# Patient Record
Sex: Female | Born: 1965 | Race: White | Hispanic: No | Marital: Married | Smoking: Never smoker
Health system: Southern US, Community
[De-identification: ages and names within clinical notes are randomized; demographics above are authoritative.]

## PROBLEM LIST (undated history)

## (undated) DIAGNOSIS — E785 Hyperlipidemia, unspecified: Secondary | ICD-10-CM

## (undated) DIAGNOSIS — F32A Depression, unspecified: Secondary | ICD-10-CM

## (undated) DIAGNOSIS — F419 Anxiety disorder, unspecified: Secondary | ICD-10-CM

## (undated) DIAGNOSIS — R011 Cardiac murmur, unspecified: Secondary | ICD-10-CM

## (undated) DIAGNOSIS — I10 Essential (primary) hypertension: Secondary | ICD-10-CM

## (undated) DIAGNOSIS — F329 Major depressive disorder, single episode, unspecified: Secondary | ICD-10-CM

## (undated) DIAGNOSIS — G250 Essential tremor: Secondary | ICD-10-CM

## (undated) HISTORY — DX: Anxiety disorder, unspecified: F41.9

## (undated) HISTORY — DX: Essential tremor: G25.0

## (undated) HISTORY — DX: Depression, unspecified: F32.A

## (undated) HISTORY — DX: Essential (primary) hypertension: I10

## (undated) HISTORY — DX: Hyperlipidemia, unspecified: E78.5

## (undated) HISTORY — DX: Major depressive disorder, single episode, unspecified: F32.9

## (undated) HISTORY — DX: Cardiac murmur, unspecified: R01.1

---

## 1988-10-02 HISTORY — PX: FINGER SURGERY: SHX640

## 1999-09-28 ENCOUNTER — Other Ambulatory Visit: Admission: RE | Admit: 1999-09-28 | Discharge: 1999-09-28 | Payer: Self-pay | Admitting: Internal Medicine

## 1999-11-07 ENCOUNTER — Encounter (INDEPENDENT_AMBULATORY_CARE_PROVIDER_SITE_OTHER): Payer: Self-pay | Admitting: Specialist

## 1999-11-07 ENCOUNTER — Other Ambulatory Visit: Admission: RE | Admit: 1999-11-07 | Discharge: 1999-11-07 | Payer: Self-pay | Admitting: *Deleted

## 2000-04-23 ENCOUNTER — Other Ambulatory Visit: Admission: RE | Admit: 2000-04-23 | Discharge: 2000-04-23 | Payer: Self-pay | Admitting: *Deleted

## 2000-08-06 ENCOUNTER — Other Ambulatory Visit: Admission: RE | Admit: 2000-08-06 | Discharge: 2000-08-06 | Payer: Self-pay | Admitting: *Deleted

## 2000-12-03 ENCOUNTER — Other Ambulatory Visit: Admission: RE | Admit: 2000-12-03 | Discharge: 2000-12-03 | Payer: Self-pay | Admitting: *Deleted

## 2001-04-17 ENCOUNTER — Emergency Department (HOSPITAL_COMMUNITY): Admission: EM | Admit: 2001-04-17 | Discharge: 2001-04-17 | Payer: Self-pay

## 2001-04-22 ENCOUNTER — Encounter (INDEPENDENT_AMBULATORY_CARE_PROVIDER_SITE_OTHER): Payer: Self-pay

## 2001-04-22 ENCOUNTER — Encounter: Payer: Self-pay | Admitting: *Deleted

## 2001-04-22 ENCOUNTER — Inpatient Hospital Stay (HOSPITAL_COMMUNITY): Admission: EM | Admit: 2001-04-22 | Discharge: 2001-04-24 | Payer: Self-pay | Admitting: Internal Medicine

## 2001-06-17 ENCOUNTER — Encounter: Admission: RE | Admit: 2001-06-17 | Discharge: 2001-06-17 | Payer: Self-pay | Admitting: *Deleted

## 2001-06-17 ENCOUNTER — Encounter: Payer: Self-pay | Admitting: Obstetrics and Gynecology

## 2001-10-02 HISTORY — PX: GALLBLADDER SURGERY: SHX652

## 2003-01-29 ENCOUNTER — Encounter: Admission: RE | Admit: 2003-01-29 | Discharge: 2003-01-29 | Payer: Self-pay | Admitting: Obstetrics and Gynecology

## 2003-01-29 ENCOUNTER — Encounter: Payer: Self-pay | Admitting: Obstetrics and Gynecology

## 2005-06-07 ENCOUNTER — Encounter: Admission: RE | Admit: 2005-06-07 | Discharge: 2005-06-07 | Payer: Self-pay | Admitting: Obstetrics and Gynecology

## 2006-06-28 ENCOUNTER — Ambulatory Visit (HOSPITAL_COMMUNITY): Admission: RE | Admit: 2006-06-28 | Discharge: 2006-06-28 | Payer: Self-pay | Admitting: Obstetrics and Gynecology

## 2007-07-16 ENCOUNTER — Ambulatory Visit (HOSPITAL_COMMUNITY): Admission: RE | Admit: 2007-07-16 | Discharge: 2007-07-16 | Payer: Self-pay | Admitting: Obstetrics and Gynecology

## 2007-10-03 DIAGNOSIS — R011 Cardiac murmur, unspecified: Secondary | ICD-10-CM

## 2007-10-03 HISTORY — DX: Cardiac murmur, unspecified: R01.1

## 2008-03-18 ENCOUNTER — Emergency Department (HOSPITAL_COMMUNITY): Admission: EM | Admit: 2008-03-18 | Discharge: 2008-03-18 | Payer: Self-pay | Admitting: Emergency Medicine

## 2008-07-23 ENCOUNTER — Ambulatory Visit (HOSPITAL_COMMUNITY): Admission: RE | Admit: 2008-07-23 | Discharge: 2008-07-23 | Payer: Self-pay | Admitting: Endocrinology

## 2009-07-27 ENCOUNTER — Ambulatory Visit (HOSPITAL_COMMUNITY): Admission: RE | Admit: 2009-07-27 | Discharge: 2009-07-27 | Payer: Self-pay | Admitting: Endocrinology

## 2010-07-28 ENCOUNTER — Ambulatory Visit (HOSPITAL_COMMUNITY): Admission: RE | Admit: 2010-07-28 | Discharge: 2010-07-28 | Payer: Self-pay | Admitting: Endocrinology

## 2010-11-18 ENCOUNTER — Other Ambulatory Visit: Payer: Self-pay | Admitting: *Deleted

## 2010-11-18 DIAGNOSIS — Z79899 Other long term (current) drug therapy: Secondary | ICD-10-CM

## 2011-02-17 NOTE — Procedures (Signed)
[name], Jamie Trujillo                        MRN: [id number] Proc. Date: 04/22/01 Adm. Date:  [id number] Attending:  Gustavo Lah A                           Procedure Report  PROCEDURE:  Upper endoscopy.  INDICATION FOR PROCEDURE:  Abdominal pain.  ANESTHESIA:  Demerol 100, Versed 12.5 mg.  DESCRIPTION OF PROCEDURE:  With the patient mildly sedated in the left lateral decubitus position, the Olympus videoscopic  endoscope was inserted in the mouth and passed under direct vision through the esophagus which appeared normal except for the distal esophagus appeared mildly inflamed although the patient had been vomiting which may have accounted for that. We entered into the stomach, the fundus, body, and antrum appeared normal except there were blood flecks seen in the stomach but the mucosa itself appeared normal. We entered into the duodenal bulb and second portion of the duodenum and mild erythema was seen in the duodenal bulb. From this point, the endoscope was slowly withdrawn taking circumferential views of the entire duodenal mucosa until the endoscope was then pulled back into the stomach, placed in retroflexion to view the stomach from below and a small hiatal hernia was seen and photographed. The endoscope was straightened and withdrawn taking circumferential views of the entire gastric and esophageal mucosa which otherwise appeared normal. Subsequently then the Olympus side viewing duodenoscope was inserted in the mouth and passed under manual guidance into the esophagus and passed distally through the stomach into the duodenum to view the side walls. No abnormalities were seen. The endoscope was withdrawn. The patients vital signs and pulse oximeter remained stable. The patient tolerated the procedure well without apparent complications.  FINDINGS:  Essentially normal mucosa other than some mild erythema of the duodenal bulb  and distal esophagus. The latter felt secondary to reflux. There was some blood streaking in the stomach, etiology of which was unclear. The patient has been on a proton pump inhibitor.  IMPRESSION:  Abdominal pain, etiology not perfectly clear at this time. The patient will be on a proton pump inhibitor therefore will consider admitting the patient for possible cholecystectomy and will discuss with Dr. Johna Sheriff. DD:  04/22/01 TD:  04/22/01 Job: [id number]

## 2011-02-17 NOTE — Discharge Summary (Signed)
[facility]  Patient:    Jamie Trujillo, Jamie Trujillo                        MRN: [id number] Adm. Date:  [id number] Disc. Date: [id number] Attending:  Sabino Gasser                           Discharge Summary  NO DICTATION DD:  05/13/01 TD:  05/13/01 Job: [id number]

## 2011-06-29 LAB — POCT CARDIAC MARKERS: Troponin i, poc: <0.05

## 2011-06-29 LAB — DIFFERENTIAL
Basophils Absolute: 0.1
Eosinophils Absolute: 0.1
Eosinophils Relative: 2
Lymphs Abs: 2.2
Monocytes Absolute: 0.5
Monocytes Relative: 6

## 2011-06-29 LAB — URINALYSIS, ROUTINE W REFLEX MICROSCOPIC
Bilirubin Urine: NEGATIVE
Nitrite: NEGATIVE
Protein, ur: NEGATIVE
Specific Gravity, Urine: 1.029

## 2011-06-29 LAB — CBC
HCT: 42.4
MCV: 93.6
Platelets: 339
RBC: 4.53
WBC: 8.1

## 2011-06-29 LAB — POCT I-STAT, CHEM 8
BUN: 13
Calcium, Ion: 1.2
Chloride: 103
Hemoglobin: 15.6 — ABNORMAL HIGH

## 2011-07-19 ENCOUNTER — Ambulatory Visit (HOSPITAL_COMMUNITY)
Admission: RE | Admit: 2011-07-19 | Discharge: 2011-07-19 | Disposition: A | Discharge status: Home / Self Care | Payer: BC Managed Care – PPO | Source: Ambulatory Visit | Attending: Endocrinology | Admitting: Endocrinology

## 2011-07-19 ENCOUNTER — Other Ambulatory Visit (HOSPITAL_COMMUNITY): Payer: Self-pay | Admitting: Endocrinology

## 2011-07-19 DIAGNOSIS — Z1231 Encounter for screening mammogram for malignant neoplasm of breast: Secondary | ICD-10-CM

## 2011-07-20 ENCOUNTER — Other Ambulatory Visit (HOSPITAL_COMMUNITY): Payer: Self-pay | Admitting: Endocrinology

## 2011-07-20 DIAGNOSIS — Z1231 Encounter for screening mammogram for malignant neoplasm of breast: Secondary | ICD-10-CM

## 2011-08-08 ENCOUNTER — Ambulatory Visit (HOSPITAL_COMMUNITY)
Admission: RE | Admit: 2011-08-08 | Discharge: 2011-08-08 | Disposition: A | Discharge status: Home / Self Care | Payer: BC Managed Care – PPO | Source: Ambulatory Visit | Attending: Endocrinology | Admitting: Endocrinology

## 2011-08-08 DIAGNOSIS — Z1231 Encounter for screening mammogram for malignant neoplasm of breast: Secondary | ICD-10-CM | POA: Insufficient documentation

## 2011-10-31 ENCOUNTER — Other Ambulatory Visit: Payer: Self-pay | Admitting: Diagnostic Neuroimaging

## 2011-10-31 DIAGNOSIS — Z79899 Other long term (current) drug therapy: Secondary | ICD-10-CM

## 2011-11-06 ENCOUNTER — Ambulatory Visit
Admission: RE | Admit: 2011-11-06 | Discharge: 2011-11-06 | Disposition: A | Discharge status: Home / Self Care | Payer: BC Managed Care – PPO | Source: Ambulatory Visit | Attending: Diagnostic Neuroimaging | Admitting: Diagnostic Neuroimaging

## 2011-11-06 DIAGNOSIS — Z79899 Other long term (current) drug therapy: Secondary | ICD-10-CM

## 2012-03-11 ENCOUNTER — Ambulatory Visit
Admission: RE | Admit: 2012-03-11 | Discharge: 2012-03-11 | Disposition: A | Discharge status: Home / Self Care | Payer: BC Managed Care – PPO | Source: Ambulatory Visit | Attending: Endocrinology | Admitting: Endocrinology

## 2012-03-11 ENCOUNTER — Other Ambulatory Visit: Payer: Self-pay | Admitting: Endocrinology

## 2012-03-11 DIAGNOSIS — R079 Chest pain, unspecified: Secondary | ICD-10-CM

## 2012-03-11 MED ORDER — IOHEXOL 350 MG/ML SOLN
125.0000 mL | Freq: Once | INTRAVENOUS | Status: AC | PRN
  Administered 2012-03-11: 125 mL via INTRAVENOUS

## 2012-08-09 ENCOUNTER — Other Ambulatory Visit (HOSPITAL_COMMUNITY): Payer: Self-pay | Admitting: Endocrinology

## 2012-08-09 DIAGNOSIS — Z1231 Encounter for screening mammogram for malignant neoplasm of breast: Secondary | ICD-10-CM

## 2012-09-05 ENCOUNTER — Ambulatory Visit (HOSPITAL_COMMUNITY)
Admission: RE | Admit: 2012-09-05 | Discharge: 2012-09-05 | Disposition: A | Discharge status: Home / Self Care | Payer: BC Managed Care – PPO | Source: Ambulatory Visit | Attending: Endocrinology | Admitting: Endocrinology

## 2012-09-05 DIAGNOSIS — Z1231 Encounter for screening mammogram for malignant neoplasm of breast: Secondary | ICD-10-CM

## 2013-12-22 ENCOUNTER — Other Ambulatory Visit: Payer: Self-pay | Admitting: Nurse Practitioner

## 2013-12-23 ENCOUNTER — Other Ambulatory Visit: Payer: Self-pay

## 2013-12-23 MED ORDER — PRIMIDONE 250 MG PO TABS: 125.0000 mg | ORAL_TABLET | Freq: Two times a day (BID) | ORAL | Status: DC

## 2013-12-23 MED ORDER — PAROXETINE HCL 20 MG PO TABS: 20.0000 mg | ORAL_TABLET | Freq: Every day | ORAL | Status: DC

## 2013-12-24 NOTE — Telephone Encounter (Signed)
Rx has been faxed.

## 2014-03-19 ENCOUNTER — Encounter: Payer: Self-pay | Admitting: Nurse Practitioner

## 2014-03-30 ENCOUNTER — Ambulatory Visit (INDEPENDENT_AMBULATORY_CARE_PROVIDER_SITE_OTHER): Payer: BC Managed Care – PPO | Admitting: Nurse Practitioner

## 2014-03-30 ENCOUNTER — Encounter: Payer: Self-pay | Admitting: Nurse Practitioner

## 2014-03-30 ENCOUNTER — Encounter (INDEPENDENT_AMBULATORY_CARE_PROVIDER_SITE_OTHER): Payer: Self-pay

## 2014-03-30 VITALS — BP 123/79 | HR 89 | Ht 64.0 in | Wt 195.0 lb

## 2014-03-30 DIAGNOSIS — S139XXA Sprain of joints and ligaments of unspecified parts of neck, initial encounter: Secondary | ICD-10-CM

## 2014-03-30 DIAGNOSIS — G252 Other specified forms of tremor: Principal | ICD-10-CM

## 2014-03-30 DIAGNOSIS — G250 Essential tremor: Secondary | ICD-10-CM | POA: Insufficient documentation

## 2014-03-30 DIAGNOSIS — S161XXA Strain of muscle, fascia and tendon at neck level, initial encounter: Secondary | ICD-10-CM | POA: Insufficient documentation

## 2014-03-30 MED ORDER — PRIMIDONE 250 MG PO TABS: 125.0000 mg | ORAL_TABLET | Freq: Two times a day (BID) | ORAL | Status: DC

## 2014-03-30 MED ORDER — CLORAZEPATE DIPOTASSIUM 7.5 MG PO TABS: 7.5000 mg | ORAL_TABLET | Freq: Two times a day (BID) | ORAL | Status: DC

## 2014-03-30 MED ORDER — PAROXETINE HCL 20 MG PO TABS: 20.0000 mg | ORAL_TABLET | Freq: Every day | ORAL | Status: DC

## 2014-03-30 NOTE — Patient Instructions (Signed)
Continue Paxil Mysoline and tranxene as ordered will refill Neck  exercises several times a day, patient given copy Followup yearly and when necessary

## 2014-03-30 NOTE — Progress Notes (Signed)
[facility]  PATIENT: Jamie KarvonenCarol E Musleh DOB: 11-14-1965   REASON FOR VISIT: for tremor   HISTORY OF PRESENT ILLNESS:Ms. Jamie Trujillo, 48 year old white female returns for followup. She has a history of benign essential tremor and has been on a combination of Paxil, Clorazepate, and Primidone. She was last seen in this office 11/11/12.   She states that her tremor gets worse with stressors. 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 She states that combination of medications have been beneficial as she performs her job. She has continued to do well with respect to her tremor. Writing sample obtained today. Complains today of neck strain particularly doing oral surgeries that last 2-3 hours and she cannot change positions. She is not exercising nor has she done any neck exercises. She claims she is stressed with her job due to the economy. She returns for reevaluation.   REVIEW OF SYSTEMS: Full 14 system review of systems performed and notable only for those listed, all others are neg:  Constitutional: fatigue Cardiovascular: Palpitations Ear/Nose/Throat: N/A  Skin: N/A  Eyes: light sensitivity Respiratory: Chest tightness Gastroitestinal: N/A  Hematology/Lymphatic: N/A  Endocrine: N/A Musculoskeletal: Neck strain, neck stiffness Allergy/Immunology: N/A  Neurological: Headache, tremors Psychiatric: Depression anxiety Sleep : Insomnia  ALLERGIES: Allergies  Allergen Reactions  . Penicillins     HOME MEDICATIONS: Outpatient Prescriptions Prior to Visit  Medication Sig Dispense Refill  . clorazepate (TRANXENE) 7.5 MG tablet TAKE 1-2 TABLETS BY MOUTH DAILY  60 tablet  2  . omeprazole (PRILOSEC) 20 MG capsule Take 20 mg by mouth daily.      Marland Kitchen. PARoxetine (PAXIL) 20 MG tablet Take 1 tablet (20 mg total) by mouth daily.  90 tablet  0  . pravastatin (PRAVACHOL) 80 MG tablet Take 80 mg by mouth daily.      . primidone (MYSOLINE) 250 MG tablet Take 0.5 tablets (125  mg total) by mouth 2 (two) times daily.  90 tablet  0   No facility-administered medications prior to visit.    PAST MEDICAL HISTORY: Past Medical History  Diagnosis Date  . Depression   . Essential tremor   . Anxiety   . Heart murmur 2009    PAST SURGICAL HISTORY: Past Surgical History  Procedure Laterality Date  . Gallbladder surgery  2003    Removed     FAMILY HISTORY: History reviewed. No pertinent family history.  SOCIAL HISTORY: History   Social History  . Marital Status: Married    Spouse Name: Jamie Trujillo     Number of Children: 0  . Years of Education: 12+   Occupational History  . Oral Surgon    Social History Main Topics  . Smoking status: Never Smoker   . Smokeless tobacco: Never Used  . Alcohol Use: Yes     Comment: Occ on weekends   . Drug Use: No  . Sexual Activity: Not on file   Other Topics Concern  . Not on file   Social History Narrative   Patient is an Transport planneroral surgeon.    Patient lives at home with husband Jamie Trujillo.    Patient has no children.    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    Patient is right handed.      PHYSICAL EXAM  Filed Vitals:   03/30/14 1116  BP: 123/79  Pulse: 89  Height: 5\' 4"  (1.626 m)  Weight: 195 lb (88.451 kg)   Body mass index is 33.46 kg/(m^2).  Generalized:  Well developed, in no acute distress  Head: normocephalic and atraumatic,. Oropharynx benign  Neck: Supple, no carotid bruits  Cardiac: Regular rate rhythm, no murmur  Musculoskeletal: No deformity   Neurological examination   Mentation: Alert oriented to time, place, history taking. Follows all commands speech and language fluent. ESS 4  Cranial nerve II-XII: Pupils were equal round reactive to light extraocular movements were full, visual field were full on confrontational test. Facial sensation and strength were normal. hearing was intact to finger rubbing bilaterally. Uvula tongue midline. head turning and shoulder shrug were normal and  symmetric.Tongue protrusion into cheek strength was normal. Motor: normal bulk and tone, full strength in the BUE, BLE, fine finger movements normal, no pronator drift. No focal weakness. No tremor. Sensory: normal and symmetric to light touch, pinprick, and  vibration in the upper and lower extremities Coordination: finger-nose-finger, heel-to-shin bilaterally, no dysmetria Reflexes: 1+ upper lower and symmetric plantar responses were flexor bilaterally. Gait and Station: Rising up from seated position without assistance, normal stance,  moderate stride, good arm swing, smooth turning, able to perform tiptoe, and heel walking without difficulty. Tandem gait is steady  DIAGNOSTIC DATA (LABS, IMAGING, TESTING) -  ASSESSMENT AND PLAN  48 y.o. year old female  has a past medical history of Depression; Essential tremor; Anxiety; and Heart murmur (2009). here to followup. New complaint today of neck strain particularly doing oral surgeries that last 2-3 hours and she cannot change positions. She is not exercising nor has she done any neck exercises  Continue Paxil Mysoline and tranxene as ordered will refill Neck  exercises several times a day, patient given copy Followup yearly and when necessary Nilda RiggsNancy Carolyn Martin, [facility] Center[facility]NP, [facility], APRN  [facility] [address], [address] [place], [place] [address] [phone]

## 2014-03-30 NOTE — Progress Notes (Signed)
I have read the note, and I agree with the clinical assessment and plan.  WILLIS,CHARLES KEITH   

## 2014-06-29 ENCOUNTER — Other Ambulatory Visit (HOSPITAL_COMMUNITY): Payer: Self-pay | Admitting: Gastroenterology

## 2014-06-29 DIAGNOSIS — R1110 Vomiting, unspecified: Secondary | ICD-10-CM

## 2014-07-13 ENCOUNTER — Ambulatory Visit (HOSPITAL_COMMUNITY)
Admission: RE | Admit: 2014-07-13 | Discharge: 2014-07-13 | Disposition: A | Discharge status: Home / Self Care | Payer: BC Managed Care – PPO | Source: Ambulatory Visit | Attending: Gastroenterology | Admitting: Gastroenterology

## 2014-07-13 DIAGNOSIS — R1110 Vomiting, unspecified: Secondary | ICD-10-CM | POA: Diagnosis not present

## 2014-07-13 MED ORDER — TECHNETIUM TC 99M SULFUR COLLOID
2.1000 | Freq: Once | INTRAVENOUS | Status: AC | PRN
  Administered 2014-07-13: 2.1 via ORAL

## 2014-08-05 ENCOUNTER — Other Ambulatory Visit (HOSPITAL_COMMUNITY): Payer: Self-pay | Admitting: Endocrinology

## 2014-08-05 DIAGNOSIS — Z1231 Encounter for screening mammogram for malignant neoplasm of breast: Secondary | ICD-10-CM

## 2014-08-11 ENCOUNTER — Ambulatory Visit (HOSPITAL_COMMUNITY)
Admission: RE | Admit: 2014-08-11 | Discharge: 2014-08-11 | Disposition: A | Discharge status: Home / Self Care | Payer: BC Managed Care – PPO | Source: Ambulatory Visit | Attending: Endocrinology | Admitting: Endocrinology

## 2014-08-11 DIAGNOSIS — Z1231 Encounter for screening mammogram for malignant neoplasm of breast: Secondary | ICD-10-CM | POA: Diagnosis not present

## 2015-02-12 ENCOUNTER — Other Ambulatory Visit: Payer: Self-pay | Admitting: Gastroenterology

## 2015-02-12 DIAGNOSIS — R197 Diarrhea, unspecified: Secondary | ICD-10-CM

## 2015-02-12 DIAGNOSIS — R768 Other specified abnormal immunological findings in serum: Secondary | ICD-10-CM

## 2015-02-12 DIAGNOSIS — R1084 Generalized abdominal pain: Secondary | ICD-10-CM

## 2015-02-22 ENCOUNTER — Ambulatory Visit
Admission: RE | Admit: 2015-02-22 | Discharge: 2015-02-22 | Disposition: A | Discharge status: Home / Self Care | Payer: BLUE CROSS/BLUE SHIELD | Source: Ambulatory Visit | Attending: Gastroenterology | Admitting: Gastroenterology

## 2015-02-22 DIAGNOSIS — R197 Diarrhea, unspecified: Secondary | ICD-10-CM

## 2015-02-22 DIAGNOSIS — R1084 Generalized abdominal pain: Secondary | ICD-10-CM

## 2015-02-22 DIAGNOSIS — R768 Other specified abnormal immunological findings in serum: Secondary | ICD-10-CM

## 2015-02-22 MED ORDER — IOPAMIDOL (ISOVUE-300) INJECTION 61%
100.0000 mL | Freq: Once | INTRAVENOUS | Status: AC | PRN
  Administered 2015-02-22: 100 mL via INTRAVENOUS

## 2015-04-02 ENCOUNTER — Other Ambulatory Visit: Payer: Self-pay | Admitting: Nurse Practitioner

## 2015-04-19 ENCOUNTER — Other Ambulatory Visit: Payer: Self-pay | Admitting: Nurse Practitioner

## 2015-04-19 ENCOUNTER — Telehealth: Payer: Self-pay | Admitting: Neurology

## 2015-04-19 ENCOUNTER — Ambulatory Visit (INDEPENDENT_AMBULATORY_CARE_PROVIDER_SITE_OTHER): Payer: BLUE CROSS/BLUE SHIELD | Admitting: Nurse Practitioner

## 2015-04-19 ENCOUNTER — Encounter: Payer: Self-pay | Admitting: Nurse Practitioner

## 2015-04-19 VITALS — BP 118/86 | HR 96 | Ht 63.0 in | Wt 191.8 lb

## 2015-04-19 DIAGNOSIS — R251 Tremor, unspecified: Secondary | ICD-10-CM

## 2015-04-19 DIAGNOSIS — G252 Other specified forms of tremor: Principal | ICD-10-CM

## 2015-04-19 DIAGNOSIS — G250 Essential tremor: Secondary | ICD-10-CM

## 2015-04-19 MED ORDER — CLORAZEPATE DIPOTASSIUM 7.5 MG PO TABS: 7.5000 mg | ORAL_TABLET | Freq: Two times a day (BID) | ORAL | Status: DC

## 2015-04-19 MED ORDER — PAROXETINE HCL 20 MG PO TABS: ORAL_TABLET | ORAL | Status: DC

## 2015-04-19 MED ORDER — PRIMIDONE 50 MG PO TABS: ORAL_TABLET | ORAL | Status: DC

## 2015-04-19 NOTE — Telephone Encounter (Signed)
I called pt.  I relayed after speaking with Jamie Trujillo/NP that she could wait and see Jamie Trujillo as scheduled.  Pt has been worked up for gastroparesis and is trying to find reason of why she has this.  Dr. Dulce Sellarutlaw is her GI doctor.  She has had multiple tests.  She failed to mention this morning at her appt that she had a elevated chromagranin level of 6 (3-5 is normal).  This is a tumor marker.    They did a CT pelvis and abd which was clear.  Gastrin level slightly elevated at 155 (which she stated omeprazole was cause).  I told her that I would let CM/NP know about this, may ask Jamie Trujillo as well.

## 2015-04-19 NOTE — Telephone Encounter (Signed)
Duplicate Request.

## 2015-04-19 NOTE — Telephone Encounter (Signed)
We can ask Dr. Terrace ArabiaYan when she comes back from vacation. She is already seeing GI specialist

## 2015-04-19 NOTE — Telephone Encounter (Signed)
Patient is inquiring if she should see Dr Terrace ArabiaYan for the reason why she has gastro paraesthesias. If Eber JonesCarolyn thinks it will not make any difference then she will wait and see Dr Terrace ArabiaYan next yr. She states she is trying to cover all bases and to feel better.Please call and advise. Patient can be reached at [phone].

## 2015-04-19 NOTE — Patient Instructions (Signed)
Change primidone to 50 mg daily Continue transitioning at current dose will refill Continue Paxil at current dose will refill Try melatonin for sleep Follow-up yearly next visit with Dr. Terrace ArabiaYan

## 2015-04-19 NOTE — Telephone Encounter (Signed)
Patient called stating the clorazepate (TRANXENE) 7.5 MG tablet was not called in. Patient can be reached at [phone].

## 2015-04-19 NOTE — Telephone Encounter (Signed)
Pharmacy has the Rx, they just did not have it filled yet when patient contacted them.  I called back.  Patient is aware.

## 2015-04-19 NOTE — Progress Notes (Signed)
[facility]  PATIENT: Jamie Trujillo DOB: [birth year]   REASON FOR VISIT: Follow-up for essential tremor  HISTORY FROM: Patient    HISTORY OF PRESENT ILLNESS:Jamie Trujillo, 49 year old white female returns for followup. She has a history of benign essential tremor and has been on a combination of Paxil, Clorazepate, and Primidone. She was last seen in this office 03/30/14. She states that her tremor gets worse with stressors. 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 She states that combination of medications have been beneficial as she performs her job. She has recently been diagnosed with gastroparesis. She reports multiple labs have been done to include labs for lupus. She has changed her diet to a gastroparesis diet. She sees Dr. Dulce Sellarutlaw. She has cut down on her Mysoline with no adverse effect on her tremor .She has continued to do well with respect to her tremor. Writing sample obtained today. Due to complaints of muscle weakness she has been taken off her statin drug for now. She returns for reevaluation.   REVIEW OF SYSTEMS: Full 14 system review of systems performed and notable only for those listed, all others are neg:  Constitutional: Fatigue Cardiovascular: neg Ear/Nose/Throat: neg  Skin: neg Eyes: Blurred vision Respiratory: Cough, shortness of breath Gastroitestinal: neg  Hematology/Lymphatic: Easy bruising  Endocrine: Flushing, heat and cold intolerance Musculoskeletal: Neck pain Stiffness Allergy/Immunology: neg Neurological: Headache tremors, weakness Psychiatric: Agitation depression and anxiety Sleep : neg   ALLERGIES: Allergies  Allergen Reactions  . Penicillins     HOME MEDICATIONS: Outpatient Prescriptions Prior to Visit  Medication Sig Dispense Refill  . Calcium Carb-Cholecalciferol (SM CALCIUM/VITAMIN D) 600-800 MG-UNIT TABS Take by mouth.    . Calcium Carbonate-Vit D-Min (CALCIUM 1200 PO) Take by mouth daily.    .  clorazepate (TRANXENE) 7.5 MG tablet Take 1 tablet (7.5 mg total) by mouth 2 (two) times daily. 180 tablet 3  . omeprazole (PRILOSEC) 20 MG capsule Take 20 mg by mouth daily.    Marland Kitchen. PARoxetine (PAXIL) 20 MG tablet TAKE 1 TABLET (20 MG TOTAL) BY MOUTH DAILY. 30 tablet 0  . pravastatin (PRAVACHOL) 80 MG tablet Take 80 mg by mouth daily.    . primidone (MYSOLINE) 250 MG tablet Take 0.5 tablets (125 mg total) by mouth 2 (two) times daily. 90 tablet 3   No facility-administered medications prior to visit.    PAST MEDICAL HISTORY: Past Medical History  Diagnosis Date  . Depression   . Essential tremor   . Anxiety   . Heart murmur 2009    PAST SURGICAL HISTORY: Past Surgical History  Procedure Laterality Date  . Gallbladder surgery  2003    Removed     FAMILY HISTORY: No family history on file.  SOCIAL HISTORY: History   Social History  . Marital Status: Married    Spouse Name: Jamie Trujillo   . Number of Children: 0  . Years of Education: 12+   Occupational History  . Oral Surgon    Social History Main Topics  . Smoking status: Never Smoker   . Smokeless tobacco: Never Used  . Alcohol Use: Yes     Comment: Occ on weekends   . Drug Use: No  . Sexual Activity: Not on file   Other Topics Concern  . Not on file   Social History Narrative   Patient is an Transport planneroral surgeon.    Patient lives at home with husband Jamie Trujillo.    Patient has no children.  Patient is working at a Theme park manager.    Patient is right handed.      PHYSICAL EXAM  Filed Vitals:   04/19/15 0936  Height:  (1.6 m)  Weight: 191 lb 12.8 oz (87 kg)   Body mass index is 33.98 kg/(m^2). Generalized: Well developed, in no acute distress  Head: normocephalic and atraumatic,. Oropharynx benign  Neck: Supple, no carotid bruits  Cardiac: Regular rate rhythm, no murmur  Musculoskeletal: No deformity   Neurological examination   Mentation: Alert oriented to time, place, history taking. Follows all  commands speech and language fluent.  Cranial nerve II-XII: Pupils were equal round reactive to light extraocular movements were full, visual field were full on confrontational test. Facial sensation and strength were normal. hearing was intact to finger rubbing bilaterally. Uvula tongue midline. head turning and shoulder shrug were normal and symmetric.Tongue protrusion into cheek strength was normal. Motor: normal bulk and tone, full strength in the BUE, BLE, fine finger movements normal, no pronator drift. No focal weakness. No tremor. No cogwheeling Sensory: normal and symmetric to light touch, pinprick, and vibration in the upper and lower extremities Coordination: finger-nose-finger, heel-to-shin bilaterally, no dysmetria Reflexes: 1+ upper lower and symmetric plantar responses were flexor bilaterally. Gait and Station: Rising up from seated position without assistance, normal stance, moderate stride, good arm swing, smooth turning, able to perform tiptoe, and heel walking without difficulty. Tandem gait is steady   DIAGNOSTIC DATA (LABS, IMAGING, TESTING) -  ASSESSMENT AND PLAN  49 y.o. year old female  has a past medical history of Depression; Essential tremor; Anxiety; and Heart murmur (2009) and recent diagnosis of gastroparesis here to follow-up. She has cut her Mysoline to 62.5 mg daily. She has not noticed any difference in her tremor. It is stable  Change primidone to 50 mg daily and one half tab when necessary  Continue Tranxene at current dose will refill Continue Paxil at current dose will refill Try melatonin for sleep Follow-up yearly next visit with Dr. Carmelina Noun, [facility], [facility], APRN  [facility] [address], [address] [place], [place] [address] [phone]

## 2015-04-25 NOTE — Telephone Encounter (Signed)
Chart reviewed, most recent visit in July 18th 2016, for essential tremor, Jamie Trujillo, please call and check on her to make sure that she have been take care of by her GI. If she still concerns about neurological cause for her gastroparesis, you may schedule an earlier appt with me, she has an appt in April 24 2016.

## 2015-04-26 NOTE — Progress Notes (Signed)
I have reviewed and agreed above plan. 

## 2015-04-26 NOTE — Telephone Encounter (Signed)
Left message for a return call

## 2015-04-26 NOTE — Telephone Encounter (Signed)
Spoke to patient - she is still under GI care.  Told her Dr. Terrace Arabia would be happy to see her earlier.  She will call if she needs an earlier appt once she finishes the work up with GI.

## 2015-04-26 NOTE — Telephone Encounter (Signed)
Patient returned call

## 2015-08-10 ENCOUNTER — Other Ambulatory Visit: Payer: Self-pay

## 2015-08-10 DIAGNOSIS — Z1231 Encounter for screening mammogram for malignant neoplasm of breast: Secondary | ICD-10-CM

## 2015-09-01 ENCOUNTER — Ambulatory Visit
Admission: RE | Admit: 2015-09-01 | Discharge: 2015-09-01 | Disposition: A | Discharge status: Home / Self Care | Payer: BLUE CROSS/BLUE SHIELD | Source: Ambulatory Visit

## 2015-09-01 DIAGNOSIS — Z1231 Encounter for screening mammogram for malignant neoplasm of breast: Secondary | ICD-10-CM

## 2016-03-17 ENCOUNTER — Other Ambulatory Visit: Payer: Self-pay | Admitting: Endocrinology

## 2016-03-17 DIAGNOSIS — M549 Dorsalgia, unspecified: Secondary | ICD-10-CM

## 2016-04-17 ENCOUNTER — Ambulatory Visit: Payer: BLUE CROSS/BLUE SHIELD | Admitting: Neurology

## 2016-04-24 ENCOUNTER — Ambulatory Visit: Payer: BLUE CROSS/BLUE SHIELD | Admitting: Neurology

## 2016-05-22 ENCOUNTER — Encounter: Payer: Self-pay | Admitting: Neurology

## 2016-05-22 ENCOUNTER — Ambulatory Visit (INDEPENDENT_AMBULATORY_CARE_PROVIDER_SITE_OTHER): Payer: Commercial Managed Care - HMO | Admitting: Neurology

## 2016-05-22 VITALS — BP 131/84 | HR 93 | Ht 63.0 in | Wt 191.5 lb

## 2016-05-22 DIAGNOSIS — M545 Low back pain: Secondary | ICD-10-CM | POA: Diagnosis not present

## 2016-05-22 DIAGNOSIS — G250 Essential tremor: Secondary | ICD-10-CM

## 2016-05-22 MED ORDER — PRIMIDONE 50 MG PO TABS: ORAL_TABLET | ORAL | 4 refills | Status: DC

## 2016-05-22 MED ORDER — CLORAZEPATE DIPOTASSIUM 7.5 MG PO TABS: 7.5000 mg | ORAL_TABLET | Freq: Two times a day (BID) | ORAL | 4 refills | Status: DC

## 2016-05-22 MED ORDER — PAROXETINE HCL 20 MG PO TABS: ORAL_TABLET | ORAL | 4 refills | Status: DC

## 2016-05-22 NOTE — Progress Notes (Signed)
[facility]  PATIENT: Jamie Trujillo DOB: 02-Nov-1965   REASON FOR VISIT: Follow-up for essential tremor  HISTORY FROM: Patient    HISTORY OF PRESENT ILLNESS:Ms. Jamie Trujillo, 50 year old white female returns for followup. She has a history of benign essential tremor and has been on a combination of Paxil, Clorazepate, and Primidone. She was last seen in this office 03/30/14. She states that her tremor gets worse with stressors. 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 She states that combination of medications have been beneficial as she performs her job. She has recently been diagnosed with gastroparesis. She reports multiple labs have been done to include labs for lupus. She has changed her diet to a gastroparesis diet. She sees Dr. Dulce Sellarutlaw. She has cut down on her Mysoline with no adverse effect on her tremor .She has continued to do well with respect to her tremor. Writing sample obtained today. Due to complaints of muscle weakness she has been taken off her statin drug for now. She returns for reevaluation.  UPDATE May 22 2016: Her brother died at age 50, she complains of excessive stress, some depression episodes, her hand tremor is overall under good control. She is on polypharmacy treatment includes primidone 50 mg 1 every day, extra half tablets as needed, Clorazepate 7.5 milligrams 1-2 tablets twice a day, Paxil 20 mg a day, I have refilled her medication today,  She presented with acute onset low back pain in May 2017, was treated with prednisone and Mobic, which has helped.   REVIEW OF SYSTEMS: Full 14 system review of systems performed and notable only for those listed, all others are neg:  Bruise easily, headaches, tremor, agitation, decreased concentration, depression, anxiety, back pain, neck pain, neck stiffness, insomnia, nausea, flushing, heat intolerance, right redness, light sensitivity, blurred vision, chest tightness, chest pain, palpitation,  murmur, fatigue, ringing ears, excessive sweating   ALLERGIES: Allergies  Allergen Reactions  . Penicillins     HOME MEDICATIONS: Outpatient Medications Prior to Visit  Medication Sig Dispense Refill  . ciprofloxacin-dexamethasone (CIPRODEX) otic suspension Place 4 drops into both ears as needed.     . clorazepate (TRANXENE) 7.5 MG tablet Take 1 tablet (7.5 mg total) by mouth 2 (two) times daily. 180 tablet 1  . omeprazole (PRILOSEC) 20 MG capsule Take 20 mg by mouth daily.    . ondansetron (ZOFRAN-ODT) 4 MG disintegrating tablet TAKE 1 TABLET EVERY 6 HRS AS NEEDED  1  . PARoxetine (PAXIL) 20 MG tablet TAKE 1 TABLET (20 MG TOTAL) BY MOUTH DAILY. 90 tablet 3  . primidone (MYSOLINE) 50 MG tablet 1 po daily  And additional 1/2 tab prn as needed 135 tablet 3  . saccharomyces boulardii (FLORASTOR) 250 MG capsule Take 250 mg by mouth daily.     . Calcium Carb-Cholecalciferol (SM CALCIUM/VITAMIN D) 600-800 MG-UNIT TABS Take by mouth.    . Calcium Carbonate-Vit D-Min (CALCIUM 1200 PO) Take by mouth daily.    . metroNIDAZOLE (METROGEL) 0.75 % gel APPLY TO AFFECTED AREA UP TO 2 TIMES A DAY AS NEEDED  3  . Multiple Vitamins-Minerals (WOMENS MULTI VITAMIN & MINERAL PO) Take 1 tablet by mouth daily.    . pravastatin (PRAVACHOL) 80 MG tablet Take 80 mg by mouth daily.     No facility-administered medications prior to visit.     PAST MEDICAL HISTORY: Past Medical History:  Diagnosis Date  . Anxiety   . Depression   . Essential tremor   . Heart  murmur 2009    PAST SURGICAL HISTORY: Past Surgical History:  Procedure Laterality Date  . GALLBLADDER SURGERY  2003   Removed     FAMILY HISTORY: No family history on file.  SOCIAL HISTORY: Social History   Social History  . Marital status: Married    Spouse name: Jamie CoombsDon   . Number of children: 0  . Years of education: 12+   Occupational History  . Oral Surgon    Social History Main Topics  . Smoking status: Never Smoker  . Smokeless  tobacco: Never Used  . Alcohol use Yes     Comment: Occ on weekends   . Drug use: No  . Sexual activity: Not on file   Other Topics Concern  . Not on file   Social History Narrative   Patient is an Transport planneroral surgeon.    Patient lives at home with husband [name] Trujillo.    Patient has no children.    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    Patient is right handed.      PHYSICAL EXAM  Vitals:   05/22/16 1607  BP: 131/84  Pulse: 93  Weight: 191 lb 8 oz (86.9 kg)  Height: 5\' 3"  (1.6 m)   Body mass index is 33.92 kg/m. Generalized: Well developed, in no acute distress  Head: normocephalic and atraumatic,. Oropharynx benign  Neck: Supple, no carotid bruits  Cardiac: Regular rate rhythm, no murmur  Musculoskeletal: No deformity   Neurological examination   Mentation: Alert oriented to time, place, history taking. Follows all commands speech and language fluent.  Cranial nerve II-XII: Pupils were equal round reactive to light extraocular movements were full, visual field were full on confrontational test. Facial sensation and strength were normal. hearing was intact to finger rubbing bilaterally. Uvula tongue midline. head turning and shoulder shrug were normal and symmetric.Tongue protrusion into cheek strength was normal. Motor: normal bulk and tone, full strength in the BUE, BLE, fine finger movements normal, no pronator drift. No focal weakness. No tremor. No cogwheeling Sensory: normal and symmetric to light touch, pinprick, and vibration in the upper and lower extremities Coordination: finger-nose-finger, heel-to-shin bilaterally, no dysmetria Reflexes: 1+ upper lower and symmetric plantar responses were flexor bilaterally. Gait and Station: Rising up from seated position without assistance, normal stance, moderate stride, good arm swing, smooth turning, able to perform tiptoe, and heel walking without difficulty. Tandem gait is steady   DIAGNOSTIC DATA (LABS, IMAGING,  TESTING) - ASSESSMENT AND PLAN  50 y.o. year old female  Essential tremor Anxiety  Refill her tramadol, clorazepate, Paxil,  I also educated her the potential dependency and withdrawal side effect from long-term benzo diazepam use such as clorazepate, patient stated that she has been on the medication since 1996, was started by other neurologists, there was no significant change in her dosage,  Levert FeinsteinYijun Brentney Goldbach, M.D. Ph.D.  [facility] [address] [place], [place] [address] Phone: [phone] Fax:      [phone]

## 2016-08-04 ENCOUNTER — Other Ambulatory Visit: Payer: Self-pay | Admitting: Endocrinology

## 2016-08-04 DIAGNOSIS — Z1231 Encounter for screening mammogram for malignant neoplasm of breast: Secondary | ICD-10-CM

## 2016-09-04 ENCOUNTER — Ambulatory Visit
Admission: RE | Admit: 2016-09-04 | Discharge: 2016-09-04 | Disposition: A | Discharge status: Home / Self Care | Payer: Commercial Managed Care - HMO | Source: Ambulatory Visit | Attending: Endocrinology | Admitting: Endocrinology

## 2016-09-04 DIAGNOSIS — Z1231 Encounter for screening mammogram for malignant neoplasm of breast: Secondary | ICD-10-CM

## 2016-11-30 ENCOUNTER — Other Ambulatory Visit: Payer: Self-pay | Admitting: *Deleted

## 2016-11-30 MED ORDER — CLORAZEPATE DIPOTASSIUM 7.5 MG PO TABS: 7.5000 mg | ORAL_TABLET | Freq: Two times a day (BID) | ORAL | 1 refills | Status: DC

## 2017-02-22 ENCOUNTER — Telehealth: Payer: Self-pay | Admitting: *Deleted

## 2017-02-22 NOTE — Telephone Encounter (Signed)
LMVM home for pt to return call to reschedule appt that she has on 05-21-17 to another date.

## 2017-02-23 NOTE — Telephone Encounter (Signed)
LMVM for pt on her mobile to call back and change appt. Date.

## 2017-02-28 NOTE — Telephone Encounter (Signed)
LMVM for pt on mobile that need to reschedule appt.  Asked pt to call us back.

## 2017-03-01 ENCOUNTER — Encounter: Payer: Self-pay | Admitting: *Deleted

## 2017-03-01 NOTE — Telephone Encounter (Signed)
Mailed letter to pt as she has not called back.  I cancelled appt for 05-21-17 and she is to call back to reschedule.

## 2017-05-21 ENCOUNTER — Ambulatory Visit: Payer: Commercial Managed Care - HMO | Admitting: Nurse Practitioner

## 2017-07-27 NOTE — Progress Notes (Signed)
[facility]  PATIENT: Jamie Trujillo DOB: Jul 16, 1966   REASON FOR VISIT: Follow-up for essential tremor and anxiety  HISTORY FROM: Patient    HISTORY OF PRESENT ILLNESS:Jamie Trujillo, 51 year old white female returns for followup. She has a history of benign essential tremor and has been on a combination of Paxil, Clorazepate, and Primidone. She was last seen in this office 03/30/14. She states that her tremor gets worse with stressors. 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 She states that combination of medications have been beneficial as she performs her job. She has recently been diagnosed with gastroparesis. She reports multiple labs have been done to include labs for lupus. She has changed her diet to a gastroparesis diet. She sees Dr. Dulce Sellarutlaw. She has cut down on her Mysoline with no adverse effect on her tremor .She has continued to do well with respect to her tremor. Writing sample obtained today. Due to complaints of muscle weakness she has been taken off her statin drug for now. She returns for reevaluation.  UPDATE May 22 2016: Her brother died at age 51, she complains of excessive stress, some depression episodes, her hand tremor is overall under good control. She is on polypharmacy treatment includes primidone 50 mg 1 every day, extra half tablets as needed, Clorazepate 7.5 milligrams 1-2 tablets twice a day, Paxil 20 mg a day, I have refilled her medication today,  She presented with acute onset low back pain in May 2017, was treated with prednisone and Mobic, which has helped.  UPDATE 10/29/2018[name], 51 year old female returns for follow-up with a history of benign essential tremor. She has been on a combination of Paxil primidone and clorazepate which Worked well for her. Her tremor gets worse with stress. 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 She continues to  have some chronic neck pain which is been treated by Dr. Juleen ChinaKohut . She has Flexeril to take if needed. She returns for reevaluation   REVIEW OF SYSTEMS: Full 14 system review of systems performed and notable only for those listed, all others are neg:  Constitutional:Fatigue  Cardiovascular: neg Ear/Nose/Throat: neg  Skin: neg Eyes:Light sensitivity  Respiratory: neg Gastroitestinal: neg  Hematology/Lymphatic: neg  Endocrine: neg Musculoskeletal Neck pain  Allergy/Immunology: neg Neurological:Tremors  Psychiatric:Anxiety  Sleep : neg   ALLERGIES: Allergies  Allergen Reactions  . Penicillins     HOME MEDICATIONS: Outpatient Medications Prior to Visit  Medication Sig Dispense Refill  . ciprofloxacin-dexamethasone (CIPRODEX) otic suspension Place 4 drops into both ears as needed.     . clorazepate (TRANXENE) 7.5 MG tablet Take 1 tablet (7.5 mg total) by mouth 2 (two) times daily. 180 tablet 1  . cyclobenzaprine (FLEXERIL) 10 MG tablet Take 10 mg by mouth as needed.  0  . IBUPROFEN PO Take by mouth as needed.    . Multiple Vitamins-Minerals (MULTIVITAMIN PO) Take by mouth daily.    Marland Kitchen. omeprazole (PRILOSEC) 20 MG capsule Take 20 mg by mouth daily.    . ondansetron (ZOFRAN-ODT) 4 MG disintegrating tablet TAKE 1 TABLET EVERY 6 HRS AS NEEDED  1  . PARoxetine (PAXIL) 20 MG tablet TAKE 1 TABLET (20 MG TOTAL) BY MOUTH DAILY. 90 tablet 4  . primidone (MYSOLINE) 50 MG tablet 1 po daily  And additional 1/2 tab prn as needed 135 tablet 4  . saccharomyces boulardii (FLORASTOR) 250 MG capsule Take 250  mg by mouth daily.      No facility-administered medications prior to visit.     PAST MEDICAL HISTORY: Past Medical History:  Diagnosis Date  . Anxiety   . Depression   . Essential tremor   . Heart murmur 2009    PAST SURGICAL HISTORY: Past Surgical History:  Procedure Laterality Date  . GALLBLADDER SURGERY  2003   Removed     FAMILY HISTORY: History reviewed. No pertinent family  history.  SOCIAL HISTORY: Social History   Social History  . Marital status: Married    Spouse name: Roe Coombs   . Number of children: 0  . Years of education: 12+   Occupational History  . Oral Surgon    Social History Main Topics  . Smoking status: Never Smoker  . Smokeless tobacco: Never Used  . Alcohol use Yes     Comment: Occ on weekends   . Drug use: No  . Sexual activity: Not on file   Other Topics Concern  . Not on file   Social History Narrative   Patient is an Transport planner.    Patient lives at home with husband Grayling Congress.    Patient has no children.    Patient is working at a [employer] manager.    Patient is right handed.      PHYSICAL EXAM  Vitals:   07/30/17 1522  BP: 124/80  Pulse: 88  Weight: 189 lb 6.4 oz (85.9 kg)  Height: 5\' 3"  (1.6 m)   Body mass index is 33.55 kg/m.  Generalized: Well developed, in no acute distress  Head: normocephalic and atraumatic,. Oropharynx benign  Neck: Supple, Musculoskeletal: No deformity   Neurological examination   Mentation: Alert oriented to time, place, history taking. Attention span and concentration appropriate. Recent and remote memory intact.  Follows all commands speech and language fluent.   Cranial nerve II-XII: Pupils were equal round reactive to light extraocular movements were full, visual field were full on confrontational test. Facial sensation and strength were normal. hearing was intact to finger rubbing bilaterally. Uvula tongue midline. head turning and shoulder shrug were normal and symmetric.Tongue protrusion into cheek strength was normal. Motor: normal bulk and tone, full strength in the BUE, BLE, fine finger movements normal, no pronator drift. No focal weakness.no outstretched tremor, no cogwheeling  Sensory: normal and symmetric to light touch, pinprick, and  Vibration,in the upper and lower extremities  Coordination: finger-nose-finger, heel-to-shin bilaterally, no dysmetria Reflexes:1+  upper lower and symmetric, plantar  responses were flexor bilaterally. Gait and Station: Rising up from seated position without assistance, normal stance,  moderate stride, good arm swing, smooth turning, able to perform tiptoe, and heel walking without difficulty. Tandem gait is steady  DIAGNOSTIC DATA (LABS, IMAGING, TESTING) - I reviewed patient records, labs, notes, testing and imaging myself where available.  Lab Results  Component Value Date   WBC 8.1 03/18/2008   HGB 15.6 (H) 03/18/2008   HCT 46.0 03/18/2008   MCV 93.6 03/18/2008   PLT 339 03/18/2008      Component Value Date/Time   NA 138 03/18/2008 0920   K 4.0 03/18/2008 0920   CL 103 03/18/2008 0920   GLUCOSE 135 (H) 03/18/2008 0920   BUN 13 03/18/2008 0920   CREATININE 0.9 03/18/2008 0920    ASSESSMENT AND PLAN   51 y.o. year old female With a history of essential tremor and anxiety . Tremor is in good control   PLAN: Continue Mysoline for essential tremor will refill  Continue Paxil and clorazepate will refill Try Flexeril for your neck spasms already has an Rx Follow-up yearly and when necessary I spent 20 minutes in total face to face time with the patient more than 50% of which was spent counseling and coordination of care, reviewing test results reviewing medications and discussing and reviewing the diagnosis of essential tremor, anxiety, and neck spasms Nilda Riggs, [facility], [facility], APRN  [facility] [address] [place], [place] [address] [phone]

## 2017-07-30 ENCOUNTER — Encounter: Payer: Self-pay | Admitting: Nurse Practitioner

## 2017-07-30 ENCOUNTER — Ambulatory Visit (INDEPENDENT_AMBULATORY_CARE_PROVIDER_SITE_OTHER): Payer: 59 | Admitting: Nurse Practitioner

## 2017-07-30 VITALS — BP 124/80 | HR 88 | Ht 63.0 in | Wt 189.4 lb

## 2017-07-30 DIAGNOSIS — S161XXA Strain of muscle, fascia and tendon at neck level, initial encounter: Secondary | ICD-10-CM

## 2017-07-30 DIAGNOSIS — G250 Essential tremor: Secondary | ICD-10-CM

## 2017-07-30 DIAGNOSIS — F419 Anxiety disorder, unspecified: Secondary | ICD-10-CM | POA: Insufficient documentation

## 2017-07-30 MED ORDER — PRIMIDONE 50 MG PO TABS: ORAL_TABLET | ORAL | 3 refills | Status: DC

## 2017-07-30 MED ORDER — PAROXETINE HCL 20 MG PO TABS: ORAL_TABLET | ORAL | 4 refills | Status: DC

## 2017-07-30 MED ORDER — CLORAZEPATE DIPOTASSIUM 7.5 MG PO TABS: 7.5000 mg | ORAL_TABLET | Freq: Two times a day (BID) | ORAL | 1 refills | Status: DC

## 2017-07-30 NOTE — Patient Instructions (Signed)
Continue Mysoline for essential tremor will refill Continue Paxil and clorazepate will refill Try Flexeril for your neck spasms already has an Rx Follow-up yearly and when necessary

## 2017-07-31 NOTE — Progress Notes (Signed)
I have reviewed and agreed above plan. 

## 2017-07-31 NOTE — Progress Notes (Signed)
Fax confirmation received for tran[facility]ene [facility] [phone]

## 2018-07-29 ENCOUNTER — Other Ambulatory Visit: Payer: Self-pay | Admitting: Nurse Practitioner

## 2018-07-29 NOTE — Telephone Encounter (Signed)
Pt has called for a refill on her clorazepate (TRANXENE) 7.5 MG tablet [facility] [place], [place] - [address] [phone] (Phone) [phone] (Fax)

## 2018-07-30 ENCOUNTER — Encounter: Payer: Self-pay | Admitting: *Deleted

## 2018-07-30 MED ORDER — CLORAZEPATE DIPOTASSIUM 7.5 MG PO TABS: 7.5000 mg | ORAL_TABLET | Freq: Two times a day (BID) | ORAL | 0 refills | Status: DC

## 2018-07-30 NOTE — Addendum Note (Signed)
Addended by: Guy Begin on: 07/30/2018 10:56 AM   Modules accepted: Orders

## 2018-07-30 NOTE — Telephone Encounter (Signed)
Pt has appt 11/2018 (made 07-30-18).

## 2018-07-30 NOTE — Telephone Encounter (Signed)
Drug Registry checked.  Last fill 07/31/2017 #180 (90 day supply).

## 2018-07-30 NOTE — Telephone Encounter (Signed)
This opened in error

## 2018-08-05 ENCOUNTER — Ambulatory Visit: Payer: 59 | Admitting: Nurse Practitioner

## 2018-09-06 ENCOUNTER — Other Ambulatory Visit: Payer: Self-pay | Admitting: Nurse Practitioner

## 2018-11-08 NOTE — Progress Notes (Signed)
[facility]  PATIENT: Jamie Trujillo DOB: [birth year]   REASON FOR VISIT: Follow-up for essential tremor and anxiety  HISTORY FROM: Patient    HISTORY OF PRESENT ILLNESS:Jamie Trujillo, 53 year old white female returns for followup. She has a history of benign essential tremor and has been on a combination of Paxil, Clorazepate, and Primidone. She was last seen in this office 03/30/14. She states that her tremor gets worse with stressors. 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 She states that combination of medications have been beneficial as she performs her job. She has recently been diagnosed with gastroparesis. She reports multiple labs have been done to include labs for lupus. She has changed her diet to a gastroparesis diet. She sees Dr. Dulce Sellar. She has cut down on her Mysoline with no adverse effect on her tremor .She has continued to do well with respect to her tremor. Writing sample obtained today. Due to complaints of muscle weakness she has been taken off her statin drug for now. She returns for reevaluation.  UPDATE May 31, 2016: Her brother died at age 36, she complains of excessive stress, some depression episodes, her hand tremor is overall under good control. She is on polypharmacy treatment includes primidone 50 mg 1 every day, extra half tablets as needed, Clorazepate 7.5 milligrams 1-2 tablets twice a day, Paxil 20 mg a day, I have refilled her medication today,  She presented with acute onset low back pain in May 2017, was treated with prednisone and Mobic, which has helped.  UPDATE 10/29/2018[name], 53 year old female returns for follow-up with a history of benign essential tremor. She has been on a combination of Paxil primidone and clorazepate which Worked well for her. Her tremor gets worse with stress. 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 She continues to  have some chronic neck pain which is been treated by Dr. Juleen China . She has Flexeril to take if needed. She returns for reevaluation  UPDATE 2/10/2020[name], 53 year old female returns for follow-up with history of benign essential tremor.  She has been on a combination of Paxil primidone and Tranxene.  He has been under a lot of stress.  Her office manager suddenly quit and her new office manager is not getting along well with staff or the patient who is an Transport planner.  She continues to have chronic neck pain and takes Flexeril as needed she also takes ibuprofen.  She tells me that she was at the beach several months ago and had episode of palpitations and chest pain.  She took a nitroglycerin from her mother.  She did not go to the ER.  She has not had a routine physical exam in a while.  She was encouraged to see her primary care she also reports fatigue and palpitations at times which could be thyroid or other disease processes.  She does not have time to exercise.  She returns for reevaluation REVIEW OF SYSTEMS: Full 14 system review of systems performed and notable only for those listed, all others are neg:  Constitutional:Fatigue  Cardiovascular: neg Ear/Nose/Throat: neg  Skin: neg Eyes:Light sensitivity  Respiratory: neg Gastroitestinal: neg  Hematology/Lymphatic: neg  Endocrine: neg Musculoskeletal Neck pain  Allergy/Immunology: neg Neurological:Tremors  Psychiatric:Anxiety , stress Sleep : neg   ALLERGIES: Allergies  Allergen Reactions  . Penicillins     HOME MEDICATIONS: Outpatient Medications Prior to Visit  Medication Sig Dispense Refill  . ciprofloxacin-dexamethasone (CIPRODEX) otic suspension Place 4 drops into both ears as needed.     . clorazepate (TRANXENE) 7.5 MG tablet Take 1 tablet (7.5 mg total) by mouth 2 (two) times daily. (Patient taking differently: Take 7.5 mg by mouth 2 (two) times daily. Takes Tues thru Friday (only one tablet).) 180 tablet 0  .  cyclobenzaprine (FLEXERIL) 10 MG tablet Take 10 mg by mouth as needed.  0  . IBUPROFEN PO Take by mouth as needed (takes 1-2 4-5x week. (600mg  tablet)).     . Multiple Vitamins-Minerals (MULTIVITAMIN PO) Take by mouth daily.    Marland Kitchen. omeprazole (PRILOSEC) 20 MG capsule Take 20 mg by mouth daily.    . ondansetron (ZOFRAN-ODT) 4 MG disintegrating tablet TAKE 1 TABLET EVERY 6 HRS AS NEEDED  1  . PARoxetine (PAXIL) 20 MG tablet TAKE 1 TABLET (20 MG TOTAL) BY MOUTH DAILY. (Patient taking differently: TAKE 1 TABLET (20 MG TOTAL) BY MOUTH Tues thru Friday.) 90 tablet 4  . primidone (MYSOLINE) 50 MG tablet TAKE 1 TABLET BY MOUTH  DAILY AND AN ADDITIONAL 1/2 TABLET DAILY AS NEEDED (Patient taking differently: TAKE 1 TABLET BY MOUTH  DAILY (takes Tuesday thru Friday). AND AN ADDITIONAL 1/2 TABLET DAILY AS NEEDED) 135 tablet 1  . saccharomyces boulardii (FLORASTOR) 250 MG capsule Take 250 mg by mouth daily.      No facility-administered medications prior to visit.     PAST MEDICAL HISTORY: Past Medical History:  Diagnosis Date  . Anxiety   . Depression   . Essential tremor   . Heart murmur 2009    PAST SURGICAL HISTORY: Past Surgical History:  Procedure Laterality Date  . GALLBLADDER SURGERY  2003   Removed     FAMILY HISTORY: History reviewed. No pertinent family history.  SOCIAL HISTORY: Social History   Socioeconomic History  . Marital status: Married    Spouse name: [name]   . Number of children: 0  . Years of education: 12+  . Highest education level: Not on file  Occupational History  . Occupation: Oral Surgon  Social Needs  . Financial resource strain: Not on file  . Food insecurity:    Worry: Not on file    Inability: Not on file  . Transportation needs:    Medical: Not on file    Non-medical: Not on file  Tobacco Use  . Smoking status: Never Smoker  . Smokeless tobacco: Never Used  Substance and Sexual Activity  . Alcohol use: Yes    Comment: Occ on weekends   . Drug  use: No  . Sexual activity: Not on file  Lifestyle  . Physical activity:    Days per week: Not on file    Minutes per session: Not on file  . Stress: Not on file  Relationships  . Social connections:    Talks on phone: Not on file    Gets together: Not on file    Attends religious service: Not on file    Active member of club or organization: Not on file    Attends meetings of clubs or organizations: Not on file    Relationship status: Not on file  . Intimate partner violence:    Fear of current or ex partner: Not on file    Emotionally abused: Not on file    Physically abused: Not on file    Forced sexual activity: Not on file  Other Topics Concern  . Not on file  Social History Narrative   Patient is an Transport planner.    Patient lives at home with husband Grayling Congress.    Patient has no children.    Patient is working at a [employer] park manager.    Patient is right handed.      PHYSICAL EXAM  Vitals:   11/11/18 1416  BP: (!) 149/91  Pulse: 100  Weight: 194 lb 3.2 oz (88.1 kg)  Height: 5\' 3"  (1.6 m)   Body mass index is 34.4 kg/m.  Generalized: Well developed, in no acute distress  Head: normocephalic and atraumatic,. Oropharynx benign  Neck: Supple, Musculoskeletal: No deformity   Neurological examination   Mentation: Alert oriented to time, place, history taking. Attention span and concentration appropriate. Recent and remote memory intact.  Follows all commands speech and language fluent.   Cranial nerve II-XII: Pupils were equal round reactive to light extraocular movements were full, visual field were full on confrontational test. Facial sensation and strength were normal. hearing was intact to finger rubbing bilaterally. Uvula tongue midline. head turning and shoulder shrug were normal and symmetric.Tongue protrusion into cheek strength was normal. Motor: normal bulk and tone, full strength in the BUE, BLE, fine finger movements normal, no pronator drift. No focal  weakness.no outstretched tremor, no cogwheeling  Sensory: normal and symmetric to light touch, pinprick, and  Vibration,in the upper and lower extremities  Coordination: finger-nose-finger, heel-to-shin bilaterally, no dysmetria Reflexes:1+ upper lower and symmetric, plantar  responses were flexor bilaterally. Gait and Station: Rising up from seated position without assistance, normal stance,  moderate stride, good arm swing, smooth turning, able to perform tiptoe, and heel walking without difficulty. Tandem gait is steady  DIAGNOSTIC DATA (LABS, IMAGING, TESTING) - I reviewed patient records, labs, notes, testing and imaging myself where available.  Lab Results  Component Value Date   WBC 8.1 03/18/2008   HGB 15.6 (H) 03/18/2008   HCT 46.0 03/18/2008   MCV 93.6 03/18/2008   PLT 339 03/18/2008      Component Value Date/Time   NA 138 03/18/2008 0920   K 4.0 03/18/2008 0920   CL 103 03/18/2008 0920   GLUCOSE 135 (H) 03/18/2008 0920   BUN 13 03/18/2008 0920   CREATININE 0.9 03/18/2008 0920    ASSESSMENT AND PLAN   53 y.o. year old female With a history of essential tremor and anxiety . Tremor is in good control .  She has been under more stress recently with her dental practice.  PLAN: Continue Mysoline for essential tremor will refill Increase  Paxil  To 30mg  daily Continue clorazepate at current dose will refill Follow up with PCP provider for fatigue and palpatations and routine PE Follow-up yearly and when necessary I spent 20 minutes in total face to face time with the patient more than 50% of which was spent counseling and coordination of care, reviewing test results reviewing medications and discussing and reviewing the diagnosis of essential tremor, anxiety, and neck spasms, palpitations and fatigue Nilda Riggs, [facility], [facility], APRN  [facility] [address], [address] [place], [place] [address] [phone]

## 2018-11-11 ENCOUNTER — Ambulatory Visit: Payer: 59 | Admitting: Nurse Practitioner

## 2018-11-11 ENCOUNTER — Encounter: Payer: Self-pay | Admitting: Nurse Practitioner

## 2018-11-11 VITALS — BP 149/91 | HR 100 | Ht 63.0 in | Wt 194.2 lb

## 2018-11-11 DIAGNOSIS — G250 Essential tremor: Secondary | ICD-10-CM | POA: Diagnosis not present

## 2018-11-11 DIAGNOSIS — F419 Anxiety disorder, unspecified: Secondary | ICD-10-CM | POA: Diagnosis not present

## 2018-11-11 MED ORDER — PAROXETINE HCL 30 MG PO TABS: 30.0000 mg | ORAL_TABLET | Freq: Every day | ORAL | 3 refills | Status: DC

## 2018-11-11 MED ORDER — CLORAZEPATE DIPOTASSIUM 7.5 MG PO TABS: ORAL_TABLET | ORAL | 3 refills | Status: DC

## 2018-11-11 MED ORDER — PRIMIDONE 50 MG PO TABS: ORAL_TABLET | ORAL | 1 refills | Status: DC

## 2018-11-11 NOTE — Progress Notes (Signed)
Fax confirmation received for tranxene [facility] [address] [phone].

## 2018-11-11 NOTE — Patient Instructions (Signed)
Continue Mysoline for essential tremor will refill Increase  Paxil  To 30mg  daily Continue clorazepate at current dose will refill Follow up with PCP provider for fatigue and palpatations and routine PE Follow-up yearly and when necessary

## 2018-11-12 NOTE — Progress Notes (Signed)
I have reviewed and agreed above plan. 

## 2019-05-03 DIAGNOSIS — R070 Pain in throat: Secondary | ICD-10-CM | POA: Insufficient documentation

## 2019-05-03 DIAGNOSIS — K219 Gastro-esophageal reflux disease without esophagitis: Secondary | ICD-10-CM | POA: Insufficient documentation

## 2019-05-03 DIAGNOSIS — B369 Superficial mycosis, unspecified: Secondary | ICD-10-CM | POA: Insufficient documentation

## 2019-05-06 ENCOUNTER — Other Ambulatory Visit: Payer: Self-pay | Admitting: Otolaryngology

## 2019-05-06 DIAGNOSIS — R070 Pain in throat: Secondary | ICD-10-CM

## 2019-05-07 ENCOUNTER — Ambulatory Visit
Admission: RE | Admit: 2019-05-07 | Discharge: 2019-05-07 | Disposition: A | Discharge status: Home / Self Care | Payer: 59 | Source: Ambulatory Visit | Attending: Otolaryngology | Admitting: Otolaryngology

## 2019-05-07 ENCOUNTER — Encounter: Payer: Self-pay | Admitting: Radiology

## 2019-05-07 DIAGNOSIS — R070 Pain in throat: Secondary | ICD-10-CM

## 2019-05-07 MED ORDER — IOPAMIDOL (ISOVUE-300) INJECTION 61%
75.0000 mL | Freq: Once | INTRAVENOUS | Status: AC | PRN
  Administered 2019-05-07: 75 mL via INTRAVENOUS

## 2019-06-17 ENCOUNTER — Other Ambulatory Visit: Payer: Self-pay | Admitting: *Deleted

## 2019-06-17 MED ORDER — CLORAZEPATE DIPOTASSIUM 7.5 MG PO TABS: ORAL_TABLET | ORAL | 3 refills | Status: DC

## 2019-06-17 NOTE — Telephone Encounter (Signed)
Drug registry checked , last fill 11-11-18 #36. Tranxene 7.5mg . Last seen 11-11-18, next appt 11/2019 with SS/NP.

## 2019-07-17 ENCOUNTER — Other Ambulatory Visit: Payer: Self-pay | Admitting: Internal Medicine

## 2019-07-17 DIAGNOSIS — Z1231 Encounter for screening mammogram for malignant neoplasm of breast: Secondary | ICD-10-CM

## 2019-09-03 ENCOUNTER — Ambulatory Visit: Payer: 59

## 2019-11-17 ENCOUNTER — Ambulatory Visit: Payer: 59 | Admitting: Neurology

## 2019-12-18 ENCOUNTER — Other Ambulatory Visit: Payer: Self-pay | Admitting: Neurology

## 2020-02-02 ENCOUNTER — Ambulatory Visit: Payer: 59 | Admitting: Neurology

## 2020-02-02 ENCOUNTER — Encounter: Payer: Self-pay | Admitting: Neurology

## 2020-02-02 VITALS — BP 133/82 | HR 88 | Temp 97.0°F | Ht 62.5 in | Wt 203.0 lb

## 2020-02-02 DIAGNOSIS — F419 Anxiety disorder, unspecified: Secondary | ICD-10-CM | POA: Diagnosis not present

## 2020-02-02 DIAGNOSIS — G250 Essential tremor: Secondary | ICD-10-CM

## 2020-02-02 MED ORDER — PRIMIDONE 50 MG PO TABS: ORAL_TABLET | ORAL | 4 refills | Status: DC

## 2020-02-02 MED ORDER — PAROXETINE HCL 30 MG PO TABS: 30.0000 mg | ORAL_TABLET | Freq: Every day | ORAL | 3 refills | Status: DC

## 2020-02-02 NOTE — Patient Instructions (Addendum)
It was great to meet you today! Continue current medications See you in 1 year

## 2020-02-02 NOTE — Progress Notes (Signed)
PATIENT: Jamie Trujillo DOB: [birth year]  REASON FOR VISIT: follow up HISTORY FROM: patient  HISTORY OF PRESENT ILLNESS: Today 02/02/20  HISTORY HISTORY OF PRESENT ILLNESS:Jamie Trujillo, 54 year old white female returns for followup. She has a history of benign essential tremor and has been on a combination of Paxil, Clorazepate, and Primidone. She was last seen in this office 03/30/14. She states that her tremor gets worse with stressors. 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 She states that combination of medications have been beneficial as she performs her job. She has recently been diagnosed with gastroparesis. She reports multiple labs have been done to include labs for lupus. She has changed her diet to a gastroparesis diet. She sees Dr. Dulce Sellar. She has cut down on her Mysoline with no adverse effect on her tremor .She has continued to do well with respect to her tremor. Writing sample obtained today. Due to complaints of muscle weakness she has been taken off her statin drug for now. She returns for reevaluation.  UPDATE 06/06/16: Her brother died atage 14, she complains of excessive stress, some depression episodes, her hand tremor is overall under good control.She is on polypharmacy treatment includes primidone 50 mg 1 every day, extra half tablets as needed, Clorazepate 7.5 milligrams 1-2 tablets twice a day, Paxil 20 mg a day, I have refilled her medication today,  She presented with acute onset low back pain in May 2017, was treated with prednisone and Mobic, which has helped.  UPDATE 10/29/2018[name], 54 year old female returns for follow-up with a history of benign essential tremor. She has been on a combination of Paxil primidone and clorazepate which Worked well for her. Her tremor gets worse with stress. 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 She continues to have some  chronic neck pain which is been treated by Dr. Juleen China . She has Flexeril to take if needed. She returns for reevaluation  UPDATE 2/10/2020[name], 54 year old female returns for follow-up with history of benign essential tremor.  She has been on a combination of Paxil primidone and Tranxene.  He has been under a lot of stress.  Her office manager suddenly quit and her new office manager is not getting along well with staff or the patient who is an Transport planner.  She continues to have chronic neck pain and takes Flexeril as needed she also takes ibuprofen.  She tells me that she was at the beach several months ago and had episode of palpitations and chest pain.  She took a nitroglycerin from her mother.  She did not go to the ER.  She has not had a routine physical exam in a while.  She was encouraged to see her primary care she also reports fatigue and palpitations at times which could be thyroid or other disease processes.  She does not have time to exercise.  She returns for reevaluation   Update Feb 02, 2020 SS: Here today for follow-up for essential tremor, she remains on Paxil, primidone, and Tranxene.  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  With tremor, is mostly in the right hand, but is well controlled with medications.  She takes Tranxene Tuesday-Friday, only taking primidone 50 mg daily, no longer taking 1/2 tablet since no lunches with other dentists. Since last seen, has gained about 10 lbs, may be  related to menopause, stress, no time for exercise.  If anxious, may notice that her right leg may shake.  She needs to see her PCP, last year saw ENT for throat issue, seems to have been sinus related, cleared up.  When last seen, her Paxil dose was increased 30 mg daily, will likely need to stay at higher dose due to continued stress, anxiety.  REVIEW OF SYSTEMS: Out of a complete 14 system review of symptoms, the patient complains only of the  following symptoms, and all other reviewed systems are negative.  Tremor  ALLERGIES: Allergies  Allergen Reactions  . Penicillins     HOME MEDICATIONS: Outpatient Medications Prior to Visit  Medication Sig Dispense Refill  . ciprofloxacin-dexamethasone (CIPRODEX) otic suspension Place 4 drops into both ears as needed.     . clorazepate (TRANXENE) 7.5 MG tablet TAKE 1 CAPSULE TWICE DAILY TUESDAY THRU FRIDAY AND IF NEEDED ON OTHER DAYS 36 tablet 0  . cyclobenzaprine (FLEXERIL) 10 MG tablet Take 10 mg by mouth as needed.  0  . IBUPROFEN PO Take by mouth as needed (takes 1-2 4-5x week. (600mg  tablet)).     . Multiple Vitamins-Minerals (MULTIVITAMIN PO) Take by mouth daily.    omeprazole (PRILOSEC) 20 MG capsule Take 20 mg by mouth daily.    . ondansetron (ZOFRAN-ODT) 4 MG disintegrating tablet TAKE 1 TABLET EVERY 6 HRS AS NEEDED  1  . saccharomyces boulardii (FLORASTOR) 250 MG capsule Take 250 mg by mouth daily.     Marland Kitchen PARoxetine (PAXIL) 30 MG tablet Take 1 tablet (30 mg total) by mouth daily. 90 tablet 3  . primidone (MYSOLINE) 50 MG tablet TAKE 1 TABLET BY MOUTH  DAILY AND AN ADDITIONAL 1/2 TABLET DAILY AS NEEDED 135 tablet 1   No facility-administered medications prior to visit.    PAST MEDICAL HISTORY: Past Medical History:  Diagnosis Date  . Anxiety   . Depression   . Essential tremor   . Heart murmur 2009    PAST SURGICAL HISTORY: Past Surgical History:  Procedure Laterality Date  . GALLBLADDER SURGERY  2003   Removed     FAMILY HISTORY: No family history on file.  SOCIAL HISTORY: Social History   Socioeconomic History  . Marital status: Married    Spouse name: 2004   . Number of children: 0  . Years of education: 12+  . Highest education level: Not on file  Occupational History  . Occupation: Oral Surgon  Tobacco Use  . Smoking status: Never Smoker  . Smokeless tobacco: Never Used  Substance and Sexual Activity  . Alcohol use: Yes    Comment: Occ on  weekends   . Drug use: No  . Sexual activity: Not on file  Other Topics Concern  . Not on file  Social History Narrative   Patient is an [employer].    Patient lives at home with husband Transport planner.    Patient has no children.    Patient is working at a [employer].    Patient is right handed.    Social Determinants of Health   Financial Resource Strain:   . Difficulty of Paying Living Expenses:   Food Insecurity:   . Worried About Theme park manager in the Last Year:   . Programme researcher, broadcasting/film/video in the Last Year:   Transportation Needs:   . Barista (Medical):   Freight forwarder Lack of Transportation (Non-Medical):   Physical Activity:   . Days of Exercise  per Week:   . Minutes of Exercise per Session:   Stress:   . Feeling of Stress :   Social Connections:   . Frequency of Communication with Friends and Family:   . Frequency of Social Gatherings with Friends and Family:   . Attends Religious Services:   . Active Member of Clubs or Organizations:   . Attends Archivist Meetings:   Marland Kitchen Marital Status:   Intimate Partner Violence:   . Fear of Current or Ex-Partner:   . Emotionally Abused:   Marland Kitchen Physically Abused:   . Sexually Abused:    PHYSICAL EXAM  Vitals:   02/02/20 1122  BP: 133/82  Pulse: 88  Temp: (!) 97 F (36.1 C)  Weight: 203 lb (92.1 kg)  Height: 5' 2.5" (1.588 m)   Body mass index is 36.54 kg/m.  Generalized: Well developed, in no acute distress  Neurological examination  Mentation: Alert oriented to time, place, history taking. Follows all commands speech and language fluent Cranial nerve II-XII: Pupils were equal round reactive to light. Extraocular movements were full, visual field were full on confrontational test. Facial sensation and strength were normal.  Head turning and shoulder shrug were normal and symmetric. Motor: The motor testing reveals 5 over 5 strength of all 4 extremities. Good symmetric motor tone is noted throughout.  No  postural tremor noted. Sensory: Sensory testing is intact to soft touch on all 4 extremities. No evidence of extinction is noted.  Coordination: Cerebellar testing reveals good finger-nose-finger and heel-to-shin bilaterally. No intention tremor noted.  Gait and station: Gait is normal. Tandem gait is normal. Romberg is negative. No drift is seen.  Reflexes: Deep tendon reflexes are symmetric and normal bilaterally.   DIAGNOSTIC DATA (LABS, IMAGING, TESTING) - I reviewed patient records, labs, notes, testing and imaging myself where available.  Lab Results  Component Value Date   WBC 8.1 03/18/2008   HGB 15.6 (H) 03/18/2008   HCT 46.0 03/18/2008   MCV 93.6 03/18/2008   PLT 339 03/18/2008      Component Value Date/Time   NA 138 03/18/2008 0920   K 4.0 03/18/2008 0920   CL 103 03/18/2008 0920   GLUCOSE 135 (H) 03/18/2008 0920   BUN 13 03/18/2008 0920   CREATININE 0.9 03/18/2008 0920   No results found for: CHOL, HDL, LDLCALC, LDLDIRECT, TRIG, CHOLHDL No results found for: HGBA1C No results found for: VITAMINB12 No results found for: TSH    ASSESSMENT AND PLAN 54 y.o. year old female  has a past medical history of Anxiety, Depression, Essential tremor, and Heart murmur (2009). here with:  1.  Essential tremor 2.  Anxiety -Tremor is currently well controlled with current medication regimen -Continue Paxil 30 mg daily -Continue primidone 50 mg daily, additional 1/2 tablet if needed -Continue Clorazepate 7.5 mg capsule Tues-Friday, and if needed on other days, was filled 12/19/2019 by Dr. Krista Blue, 36 capsules has been given 36 capsules per 30 days, we can try for 3 month supply when patient is due -See PCP for annual physical, we talked about weight loss -Follow-up in 1 year or sooner if needed  I spent 20 minutes of face-to-face and non-face-to-face time with patient.  This included previsit chart review, lab review, study review, order entry, electronic health record  documentation, patient education.  Butler Denmark, AGNP-C, DNP 02/02/2020, 11:56 AM [facility] [address], [place], [place] [address] [phone]

## 2020-02-10 NOTE — Progress Notes (Signed)
I have reviewed and agreed above plan. 

## 2020-03-12 ENCOUNTER — Other Ambulatory Visit: Payer: Self-pay | Admitting: *Deleted

## 2020-03-12 ENCOUNTER — Other Ambulatory Visit: Payer: Self-pay | Admitting: Neurology

## 2020-03-12 NOTE — Telephone Encounter (Signed)
Duplicate request

## 2020-03-19 ENCOUNTER — Other Ambulatory Visit: Payer: Self-pay | Admitting: Neurology

## 2020-03-22 ENCOUNTER — Other Ambulatory Visit: Payer: Self-pay | Admitting: Neurology

## 2020-03-22 MED ORDER — CLORAZEPATE DIPOTASSIUM 7.5 MG PO TABS: ORAL_TABLET | ORAL | 1 refills | Status: DC

## 2020-03-22 NOTE — Progress Notes (Signed)
The patient called that the pharmacy did not have the prescription for clorazepate.  It looks like it was ordered a couple weeks ago.  I checked the PDMP and there is no record of it being filled recently.  Therefore, I sent in a refill for 108 pills (77month)

## 2020-08-27 IMAGING — CT CT NECK WITH CONTRAST
5 of 6 series · 14 of 34 positions shown, 16 images · IV contrast (iopamidol)
Comparison: None.

CLINICAL DATA: Intermittent right neck swelling

EXAM:
CT NECK WITH CONTRAST
TECHNIQUE: Multidetector CT imaging of the neck was performed using the
standard protocol following the bolus administration of intravenous
contrast.
CONTRAST:  75mL ZSAUT2-833 IOPAMIDOL (ZSAUT2-833) INJECTION 61%

[Series 2: neck 2.00 br40 s3 st/ no angle · axial · 0.46mm/px · z∈[-714,-636]mm · 2 of 118 slices shown, 3 images]
[im 40/118  soft-tissue]
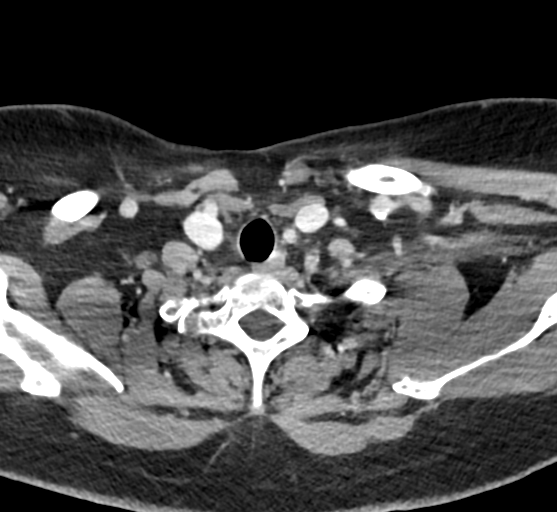
[im 40/118  bone]
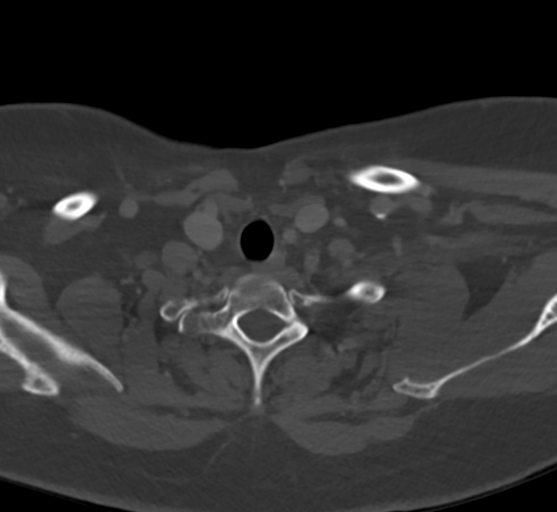
[im 79/118  bone]
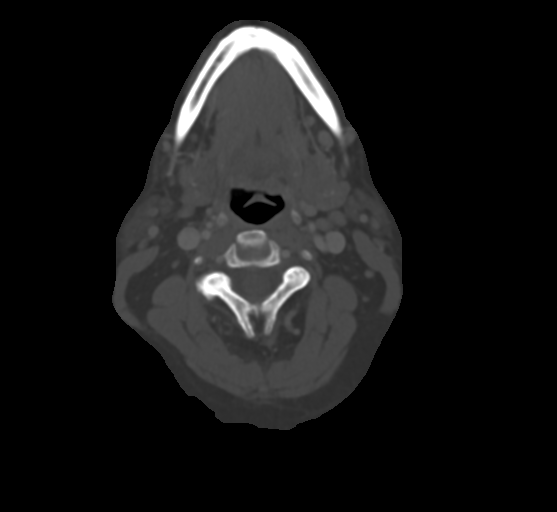

[Series 4: neck 2.00 br60 s3 bone/ no angle · axial · 0.46mm/px · z∈[-714,-636]mm · 2 of 118 slices shown]
[im 40/118  bone]
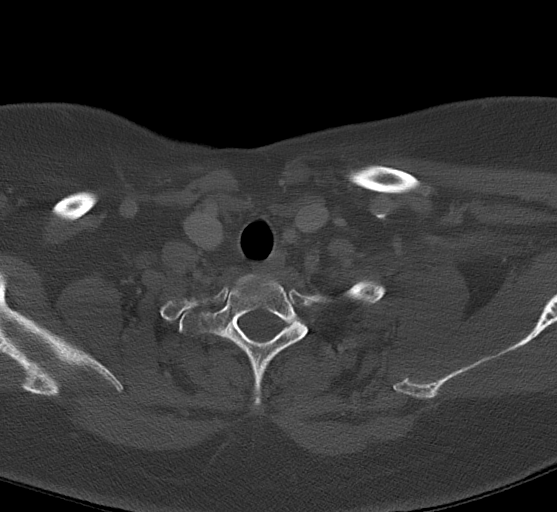
[im 79/118  bone]
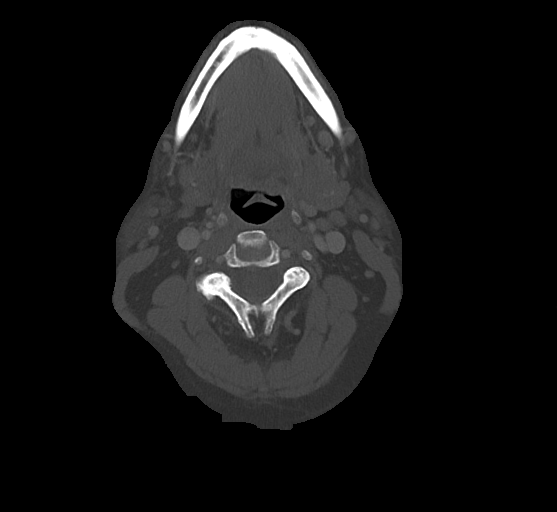

[Series 6: neck 2.00 br36 s3 angled axial (person_name) · axial · 0.46mm/px · z∈[-731,-655]mm · 2 of 118 slices shown]
[im 40/118  bone]
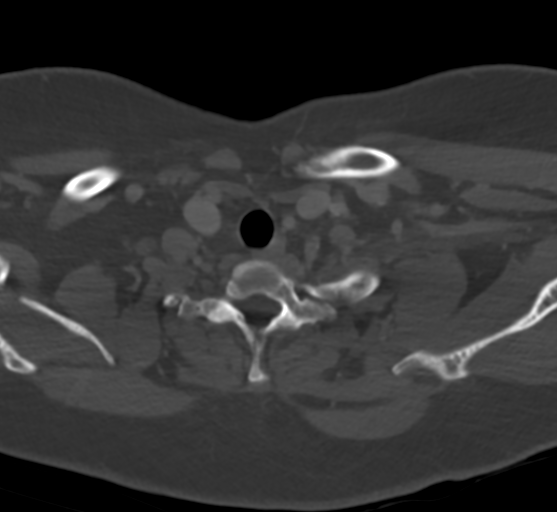
[im 79/118  bone]
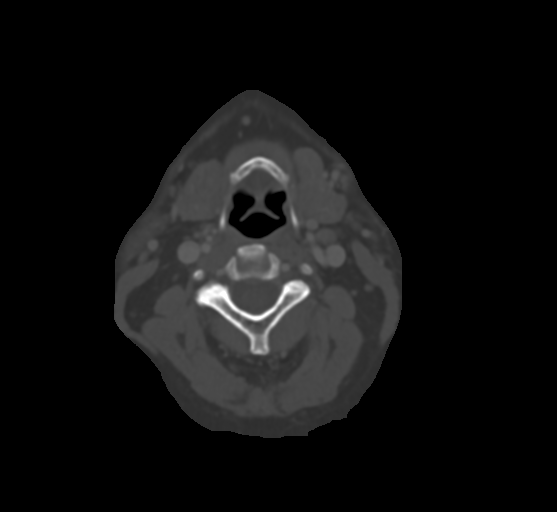

[Series 10: neck 2.00 br40 s3 (person_name) · coronal · 0.46mm/px · 3 of 117 slices shown (1 of 2)]
[im 24/117  bone]
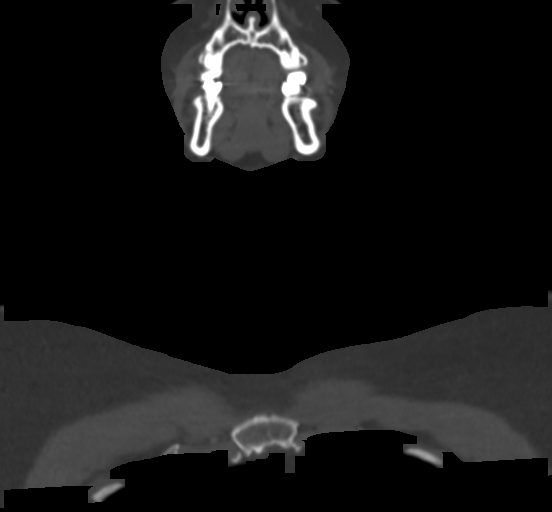
[im 47/117  bone]
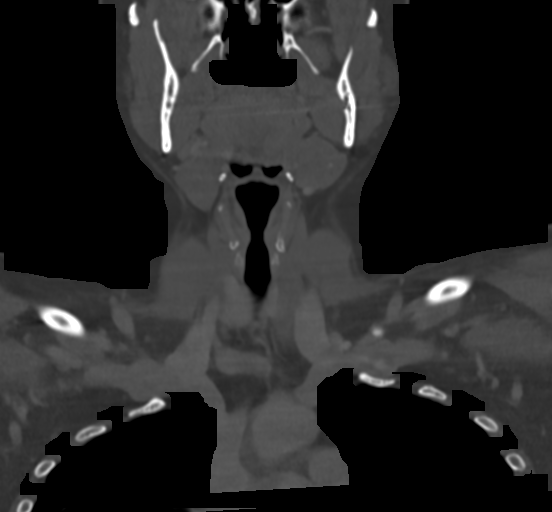
[im 70/117  bone]
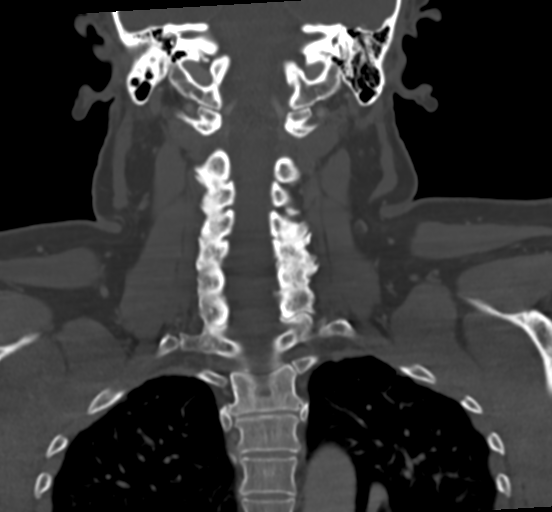

[Series 12: neck 2.00 br40 s3 (person_name) · sagittal · 0.46mm/px · 5 of 128 slices shown, 6 images (2 of 2)]
[im 43/128  bone]
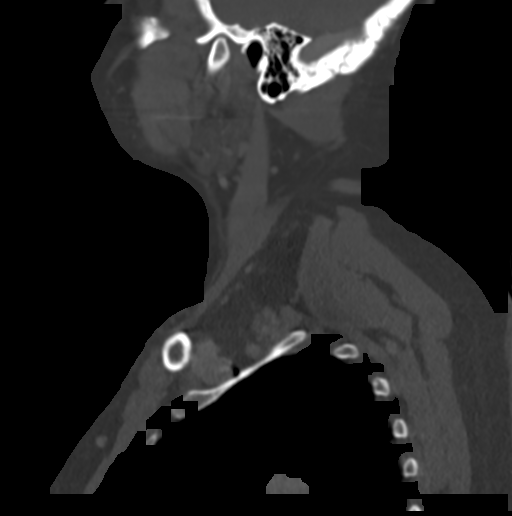
[im 53/128  bone]
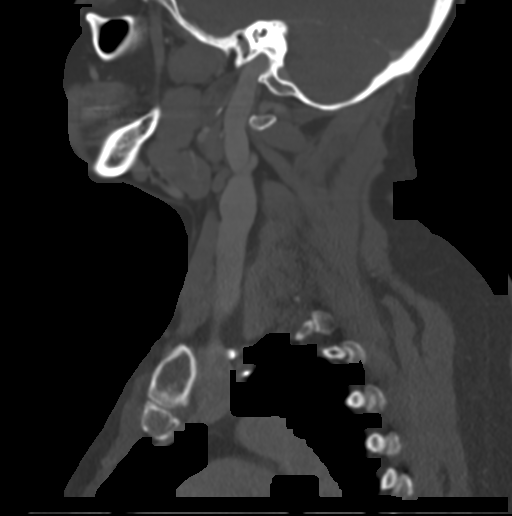
[im 64/128  soft-tissue]
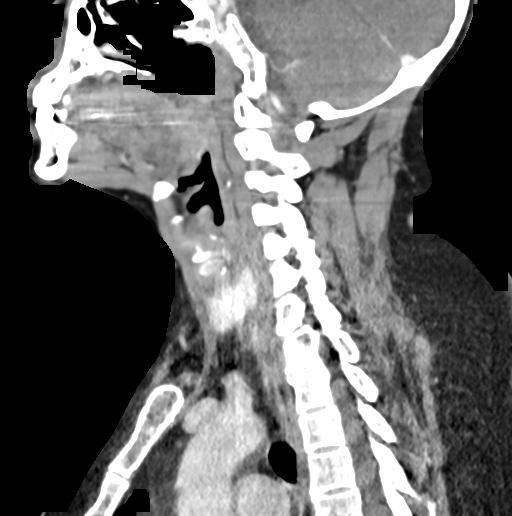
[im 64/128  bone]
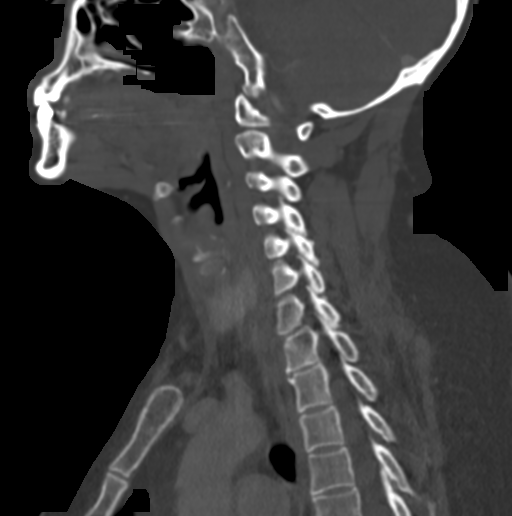
[im 75/128  bone]
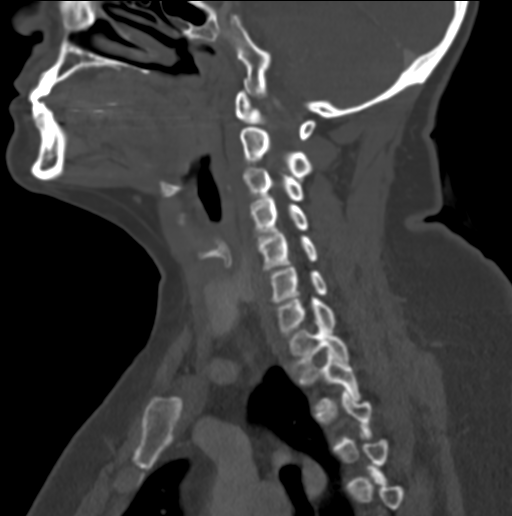
[im 85/128  bone]
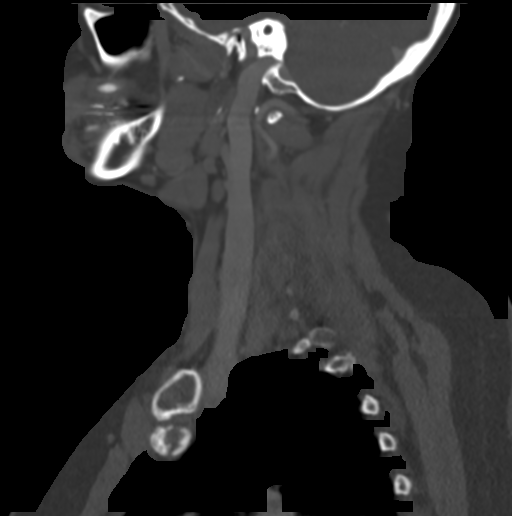

[14 of 34 positions shown; findings below may reference images not displayed]

FINDINGS: PHARYNX AND LARYNX:

--Nasopharynx: Fossae of [place] are clear. Normal adenoid
tonsils for age.

--Oral cavity and oropharynx: The palatine and lingual tonsils are
normal. The visible oral cavity and floor of mouth are normal.

--Hypopharynx: Normal vallecula and pyriform sinuses.

--Larynx: Normal epiglottis and pre-epiglottic space. Normal
aryepiglottic and vocal folds.

--Retropharyngeal space: No abscess, effusion or lymphadenopathy.

SALIVARY GLANDS:

--Parotid: No mass lesion or inflammation. No sialolithiasis or
ductal dilatation.

--Submandibular: The marked site of palpable abnormality is closest
to the right submandibular gland. There is slightly asymmetric size
of the right submandibular gland relative to the left but no other
finding to account for the reported palpable sensation.

--Sublingual: Normal. No ranula or other visible lesion of the base
of tongue and floor of mouth.

THYROID: Normal.

LYMPH NODES: No enlarged or abnormal density lymph nodes.

VASCULAR: Major cervical vessels are patent.

LIMITED INTRACRANIAL: Normal.

VISUALIZED ORBITS: Normal.

MASTOIDS AND VISUALIZED PARANASAL SINUSES: No fluid levels or
advanced mucosal thickening. No mastoid effusion.

SKELETON: No bony spinal canal stenosis. No lytic or blastic
lesions.

UPPER CHEST: Clear.

OTHER: None.
IMPRESSION: 1. Skin marker placed at the site of palpable abnormality is closest
to the right submandibular gland, which is slightly larger than the
left. This might indicate a mild intermittent sialadenitis, but
there are no other current findings of the gland support this.
2. Otherwise normal CT of the neck.

## 2020-10-08 ENCOUNTER — Ambulatory Visit: Payer: 59

## 2021-02-07 ENCOUNTER — Ambulatory Visit: Payer: 59 | Admitting: Neurology

## 2021-02-07 ENCOUNTER — Encounter: Payer: Self-pay | Admitting: Neurology

## 2021-02-07 VITALS — BP 140/92 | HR 95 | Ht 62.0 in | Wt 200.0 lb

## 2021-02-07 DIAGNOSIS — G250 Essential tremor: Secondary | ICD-10-CM

## 2021-02-07 MED ORDER — PRIMIDONE 50 MG PO TABS: ORAL_TABLET | ORAL | 4 refills | Status: DC

## 2021-02-07 MED ORDER — CLORAZEPATE DIPOTASSIUM 7.5 MG PO TABS: ORAL_TABLET | ORAL | 1 refills | Status: DC

## 2021-02-07 MED ORDER — PAROXETINE HCL 20 MG PO TABS: 20.0000 mg | ORAL_TABLET | Freq: Every day | ORAL | 3 refills | Status: DC

## 2021-02-07 NOTE — Patient Instructions (Signed)
Decrease Paxil back to 20 mg daily Keep other medications Please see your primary care doctor  See you back in 1 year or sooner if needed

## 2021-02-07 NOTE — Progress Notes (Signed)
PATIENT: Jamie Trujillo DOB: 1966/03/02  REASON FOR VISIT: follow up HISTORY FROM: patient  HISTORY OF PRESENT ILLNESS: Today 02/07/21  HISTORY HISTORY OF PRESENT ILLNESS:Jamie Trujillo, 55 year old white female returns for followup. She has a history of benign essential tremor and has been on a combination of Paxil, Clorazepate, and Primidone. She was last seen in this office 03/30/14. She states that her tremor gets worse with stressors. 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 She states that combination of medications have been beneficial as she performs her job. She has recently been diagnosed with gastroparesis. She reports multiple labs have been done to include labs for lupus. She has changed her diet to a gastroparesis diet. She sees Jamie Trujillo. She has cut down on her Mysoline with no adverse effect on her tremor .She has continued to do well with respect to her tremor. Writing sample obtained today. Due to complaints of muscle weakness she has been taken off her statin drug for now. She returns for reevaluation.  UPDATE 06/19/2016: Her brother died atage 33, she complains of excessive stress, some depression episodes, her hand tremor is overall under good control.She is on polypharmacy treatment includes primidone 50 mg 1 every day, extra half tablets as needed, Clorazepate 7.5 milligrams 1-2 tablets twice a day, Paxil 20 mg a day, I have refilled her medication today,  She presented with acute onset low back pain in May 2017, was treated with prednisone and Mobic, which has helped.  UPDATE 10/29/2018[name], 55 year old female returns for follow-up with a history of benign essential tremor. She has been on a combination of Paxil primidone and clorazepate which Worked well for her. Her tremor gets worse with stress. 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 She continues to have some  chronic neck pain which is been treated by Jamie Trujillo . She has Flexeril to take if needed. She returns for reevaluation  UPDATE 2/10/2020[name], 55 year old female returns for follow-up with history of benign essential tremor.  She has been on a combination of Paxil primidone and Tranxene.  He has been under a lot of stress.  Her office manager suddenly quit and her new office manager is not getting along well with staff or the patient who is an Transport planner.  She continues to have chronic neck pain and takes Flexeril as needed she also takes ibuprofen.  She tells me that she was at the beach several months ago and had episode of palpitations and chest pain.  She took a nitroglycerin from her mother.  She did not go to the ER.  She has not had a routine physical exam in a while.  She was encouraged to see her primary care she also reports fatigue and palpitations at times which could be thyroid or other disease processes.  She does not have time to exercise.  She returns for reevaluation   Update Feb 02, 2020 SS: Here today for follow-up for essential tremor, she remains on Paxil, primidone, and Tranxene.  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  With tremor, is mostly in the right hand, but is well controlled with medications.  She takes Tranxene Tuesday-Friday, only taking primidone 50 mg daily, no longer taking 1/2 tablet since no lunches with other dentists. Since last seen, has gained about 10 lbs, may be  related to menopause, stress, no time for exercise.  If anxious, may notice that her right leg may shake.  She needs to see her PCP, last year saw ENT for throat issue, seems to have been sinus related, cleared up.  When last seen, her Paxil dose was increased 30 mg daily, will likely need to stay at higher dose due to continued stress, anxiety.  Update Feb 07, 2021 SS: Remains on Paxil, primidone, Tranxene for control of essential tremor.  She is a  Education officer, community, continues with significant stress, having to help take care of her aging parents.  Tremor is well controlled.  Paxil was increased to 30 mg daily back in 2020, noting night sweats, elevated BP, heart racing.  Is overdue to see her PCP.  Only takes her tremor medications Monday-Thursday when working. A lot of fatigue, no snoring, claims OSA ruled out in past.   REVIEW OF SYSTEMS: Out of a complete 14 system review of symptoms, the patient complains only of the following symptoms, and all other reviewed systems are negative.  Tremor  ALLERGIES: Allergies  Allergen Reactions  . Penicillins     HOME MEDICATIONS: Outpatient Medications Prior to Visit  Medication Sig Dispense Refill  . betamethasone valerate (VALISONE) 0.1 % cream 1 application to affected area    . cyclobenzaprine (FLEXERIL) 10 MG tablet Take 10 mg by mouth as needed.  0  . fluocinonide ointment (LIDEX) 0.05 % Apply 1 application topically 2 (two) times daily.    . IBUPROFEN PO Take by mouth as needed (takes 1-2 4-5x week. (600mg  tablet)).     . Multiple Vitamins-Minerals (MULTIVITAMIN PO) Take by mouth daily.    omeprazole (PRILOSEC) 20 MG capsule Take 20 mg by mouth daily.    . ondansetron (ZOFRAN-ODT) 4 MG disintegrating tablet TAKE 1 TABLET EVERY 6 HRS AS NEEDED  1  . saccharomyces boulardii (FLORASTOR) 250 MG capsule Take 250 mg by mouth daily.     . clorazepate (TRANXENE) 7.5 MG tablet TAKE 1 CAPSULE TWICE DAILY TUESDAY THRU FRIDAY AND IF NEEDED ON OTHER DAYS. (Patient taking differently: No sig reported) 108 tablet 1  . PARoxetine (PAXIL) 30 MG tablet Take 1 tablet (30 mg total) by mouth daily. 90 tablet 3  . primidone (MYSOLINE) 50 MG tablet TAKE 1 TABLET BY MOUTH  DAILY AND AN ADDITIONAL 1/2 TABLET DAILY AS NEEDED 135 tablet 4  . ciprofloxacin-dexamethasone (CIPRODEX) otic suspension Place 4 drops into both ears as needed.      No facility-administered medications prior to visit.    PAST MEDICAL  HISTORY: Past Medical History:  Diagnosis Date  . Anxiety   . Depression   . Essential tremor   . Heart murmur 2009    PAST SURGICAL HISTORY: Past Surgical History:  Procedure Laterality Date  . GALLBLADDER SURGERY  2003   Removed     FAMILY HISTORY: No family history on file.  SOCIAL HISTORY: Social History   Socioeconomic History  . Marital status: Married    Spouse name: 2004   . Number of children: 0  . Years of education: 12+  . Highest education level: Not on file  Occupational History  . Occupation: Oral Surgon  Tobacco Use  . Smoking status: Never Smoker  . Smokeless tobacco: Never Used  Substance and Sexual Activity  . Alcohol use: Yes    Comment: Occ on weekends   . Drug use: No  . Sexual activity: Not on file  Other Topics Concern  . Not  on file  Social History Narrative   Patient is an Transport planner.    Patient lives at home with husband [employer].    Patient has no children.    Patient is working at a Theme park manager.    Patient is right handed.    Social Determinants of Health   Financial Resource Strain: Not on file  Food Insecurity: Not on file  Transportation Needs: Not on file  Physical Activity: Not on file  Stress: Not on file  Social Connections: Not on file  Intimate Partner Violence: Not on file   PHYSICAL EXAM  Vitals:   02/07/21 1118  BP: (!) 140/92  Pulse: 95  Weight: 200 lb (90.7 kg)  Height: 5\' 2"  (1.575 m)   Body mass index is 36.58 kg/m.  Generalized: Well developed, in no acute distress  Neurological examination  Mentation: Alert oriented to time, place, history taking. Follows all commands speech and language fluent Cranial nerve II-XII: Pupils were equal round reactive to light. Extraocular movements were full, visual field were full on confrontational test. Facial sensation and strength were normal.  Head turning and shoulder shrug were normal and symmetric. Motor: The motor testing reveals 5 over 5 strength of  all 4 extremities. Good symmetric motor tone is noted throughout.  No postural tremor noted. Sensory: Sensory testing is intact to soft touch on all 4 extremities. No evidence of extinction is noted.  Coordination: Cerebellar testing reveals good finger-nose-finger and heel-to-shin bilaterally. No intention tremor noted.  Gait and station: Gait is normal.  Reflexes: Deep tendon reflexes are symmetric and normal bilaterally.   DIAGNOSTIC DATA (LABS, IMAGING, TESTING) - I reviewed patient records, labs, notes, testing and imaging myself where available.  Lab Results  Component Value Date   WBC 8.1 03/18/2008   HGB 15.6 (H) 03/18/2008   HCT 46.0 03/18/2008   MCV 93.6 03/18/2008   PLT 339 03/18/2008      Component Value Date/Time   NA 138 03/18/2008 0920   K 4.0 03/18/2008 0920   CL 103 03/18/2008 0920   GLUCOSE 135 (H) 03/18/2008 0920   BUN 13 03/18/2008 0920   CREATININE 0.9 03/18/2008 0920   No results found for: CHOL, HDL, LDLCALC, LDLDIRECT, TRIG, CHOLHDL No results found for: 03/20/2008 No results found for: VITAMINB12 No results found for: TSH    ASSESSMENT AND PLAN 55 y.o. year old female  has a past medical history of Anxiety, Depression, Essential tremor, and Heart murmur (2009). here with:  1.  Essential tremor 2.  Anxiety -Tremor is currently well controlled with current medication regimen -Decrease Paxil 20 mg daily, possible adverse effects with 30 mg daily -Continue primidone 50 mg daily, additional 1/2 tablet if needed -Continue Clorazepate 7.5 mg capsule Monday-thursday, and if needed on other days, last fill was 09/12/2020 -Needs to see PCP for physical  -Follow-up in 1 year or sooner if needed  14/09/2020, DNP 02/07/2021, 11:47 AM [facility] [address] [place], [place] [phone]

## 2021-02-16 ENCOUNTER — Other Ambulatory Visit: Payer: Self-pay | Admitting: Neurology

## 2021-03-22 DIAGNOSIS — I10 Essential (primary) hypertension: Secondary | ICD-10-CM | POA: Insufficient documentation

## 2021-03-22 DIAGNOSIS — E782 Mixed hyperlipidemia: Secondary | ICD-10-CM | POA: Insufficient documentation

## 2021-04-29 ENCOUNTER — Other Ambulatory Visit: Payer: Self-pay

## 2021-04-29 ENCOUNTER — Encounter: Payer: Self-pay | Admitting: Cardiology

## 2021-04-29 ENCOUNTER — Ambulatory Visit: Payer: 59 | Admitting: Cardiology

## 2021-04-29 VITALS — BP 115/78 | HR 75 | Temp 97.7°F | Resp 17 | Ht 62.0 in | Wt 205.0 lb

## 2021-04-29 DIAGNOSIS — R0609 Other forms of dyspnea: Secondary | ICD-10-CM

## 2021-04-29 DIAGNOSIS — I10 Essential (primary) hypertension: Secondary | ICD-10-CM

## 2021-04-29 DIAGNOSIS — R0600 Dyspnea, unspecified: Secondary | ICD-10-CM

## 2021-04-29 DIAGNOSIS — R5381 Other malaise: Secondary | ICD-10-CM

## 2021-04-29 DIAGNOSIS — E7800 Pure hypercholesterolemia, unspecified: Secondary | ICD-10-CM

## 2021-04-29 DIAGNOSIS — R5382 Chronic fatigue, unspecified: Secondary | ICD-10-CM

## 2021-04-29 DIAGNOSIS — I209 Angina pectoris, unspecified: Secondary | ICD-10-CM

## 2021-04-29 DIAGNOSIS — I2510 Atherosclerotic heart disease of native coronary artery without angina pectoris: Secondary | ICD-10-CM

## 2021-04-29 MED ORDER — ROSUVASTATIN CALCIUM 20 MG PO TABS: 20.0000 mg | ORAL_TABLET | Freq: Every day | ORAL | 2 refills | Status: DC

## 2021-04-29 MED ORDER — ASPIRIN 81 MG PO CHEW: 81.0000 mg | CHEWABLE_TABLET | Freq: Every day | ORAL | Status: DC

## 2021-04-29 MED ORDER — NITROGLYCERIN 0.4 MG SL SUBL
0.4000 mg | SUBLINGUAL_TABLET | SUBLINGUAL | 3 refills | Status: DC | PRN
Start: 2021-04-29 — End: 2023-03-09

## 2021-04-29 NOTE — Progress Notes (Signed)
Primary Physician/Referring:  Anda Kraft, MD  Patient ID: Jamie Trujillo, female    DOB: 1965/12/05, 55 y.o.   MRN: [id number]  Chief Complaint  Patient presents with   Chest Pain   New Patient (Initial Visit)   Shortness of Breath   HPI:    Jamie Trujillo  is a 55 y.o. caucasian female with past medical history significant for mild cardiomyopathy on echo, hypertension, hyperlipidemia, benign essential tremor, and anxiety. The patient presents today for evaluation of chest pain, SOB, and hyperlipidemia.   The patient reports that she has had occasional chest pain since before she was evaluated by Dr. Einar Gip back in 2015. At that time, echo showed mild hypertrophy of the left ventricle and other findings outlined below. She also had stress testing performed that showed no ischemia. She did not see Dr. Einar Gip again after these tests. She states that in the summer of 2019 she experienced her most severe episode of chest pain. She states that she was shaving her legs when the pain started. She took 2 of her mom's nitroglycerins with no relief of the pain. The nitroglycerin did cause her to feel pre-syncopal. She spent the rest of that day lying in bed which resulted in the resolution of her chest pain. She continues to experience chest pain at least once a week. She describes some of the pains as being sharp and located to the left of her sternum. Other times she would describe them as a heaviness that lingers and radiates to her jaw and left shoulder. 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   The patient also reports that her BP has been elevated recently with readings around 150/90. She was started on Metoprolol tartrate 25 mg by her PCP and it has resulted in a reduction in her readings. The  patient also notes that she feels a new pulsation in the left side of her neck that has occurred since she noticed her blood pressure increasing.   The patient also reports new development of fatigue, high pulse, and dyspnea on exertion within the last year. The patient does not perform any exercise regularly. She also reports frequent nighttime awakenings and poor sleep. When she does sleep well, she still feels fatigued during the day. Her husband has never noticed if she snores or not. She experienced an elevation in her pulse to 118 around the time she had COVID in early July. She notes that her heart rate has improved since she started Metoprolol.   She was also noted to have an LDL of 160 by her PCP. The patient states that her PCP held off on prescribing cholesterol medication until the patient saw Dr. Einar Gip today. The patient does have a history of being on Pravastatin but she discontinued it under the guidance of another provider due to fatigue she was experiencing.   The patient also reports a weight gain of 10 pounds within the last year. She states that she had idiopathic gastroparesis until the last year. With the resolution of her gastroparesis, the patient notes that she has been eating more things like pizza and salad. She also reports that she eats late at night due to her work schedule.   When assessing her history, it was noted that she  had a brother who died at the age of 49. Upon clarification today the patient stated that her brother had transposition of the great arteries and experienced complications related to that including a heart attack.   Overall during today's visit, the patient endorses chest pain episodes, dyspnea on exertion. elevated blood pressure, elevated heart rate that is improving, elevated cholesterol, fatigue, poor sleep, dizziness that began before she started Metoprolol, and elevated stress. The patient denies lower extremity edema or any history of smoking.    Past Medical History:  Diagnosis Date   Anxiety    Depression    Essential tremor    Heart murmur 2009   Past Surgical History:  Procedure Laterality Date   FINGER SURGERY  1990   GALLBLADDER SURGERY  10/02/2001   Removed    Family History  Problem Relation Age of Onset   Heart disease Mother    Heart failure Mother    Atrial fibrillation Mother    Heart disease Father    Heart attack Brother    Heart disease Brother     Social History   Tobacco Use   Smoking status: Never   Smokeless tobacco: Never  Substance Use Topics   Alcohol use: Yes    Comment: Occ on weekends    Marital Status: Married  ROS  Review of Systems  Constitutional: Positive for malaise/fatigue and weight gain.  Cardiovascular:  Positive for chest pain and dyspnea on exertion. Negative for claudication, leg swelling, near-syncope and syncope.  Respiratory:  Negative for sleep disturbances due to breathing.   Musculoskeletal:  Positive for back pain.  Neurological:  Positive for excessive daytime sleepiness and dizziness.  Psychiatric/Behavioral:         Stress  Objective  Blood pressure 115/78, pulse 75, temperature 97.7 F (36.5 C), resp. rate 17, height '5\' 2"'  (1.575 m), weight 205 lb (93 kg), SpO2 98 %. Body mass index is 37.49 kg/m.  Vitals with BMI 04/29/2021 02/07/2021 02/02/2020  Height '5\' 2"'  '5\' 2"'  5' 2.5"  Weight 205 lbs 200 lbs 203 lbs  BMI 37.49 63.01 60.10  Systolic 932 355 732  Diastolic 78 92 82  Pulse 75 95 88    Physical Exam Vitals reviewed.  Constitutional:      Appearance: She is obese.  Neck:     Vascular: No carotid bruit or hepatojugular reflux.  Cardiovascular:     Rate and Rhythm: Normal rate and regular rhythm.     Pulses: Normal pulses.     Heart sounds: Normal heart sounds. No murmur heard.   No gallop.  Pulmonary:     Effort: Pulmonary effort is normal. No respiratory distress.     Breath sounds: Normal breath sounds. No wheezing, rhonchi or rales.   Abdominal:     General: Bowel sounds are normal.     Palpations: Abdomen is soft. There is no mass.     Tenderness: There is no abdominal tenderness.  Musculoskeletal:     Right lower leg: No edema.     Left lower leg: No edema.  Neurological:     Mental Status: She is alert.  Psychiatric:        Mood and Affect: Mood normal.        Behavior: Behavior normal.     Laboratory examination:   No results for input(s): NA, K, CL, CO2, GLUCOSE, BUN, CREATININE, CALCIUM, GFRNONAA, GFRAA in the last 8760 hours. CrCl cannot be calculated (Patient's most recent lab result is older than the maximum  21 days allowed.).  CMP 03/18/2008  Glucose 135(H)  BUN 13  Creatinine 0.9  Sodium 138  Potassium 4.0  Chloride 103   CBC 03/18/2008 03/18/2008  WBC - 8.1  Hemoglobin 15.6(H) 14.8  Hematocrit 46.0 42.4  Platelets - 339   Lipid Panel No results for input(s): CHOL, TRIG, LDLCALC, VLDL, HDL, CHOLHDL, LDLDIRECT in the last 8760 hours. Lipid Panel  No results found for: CHOL, TRIG, HDL, CHOLHDL, VLDL, LDLCALC, LDLDIRECT, LABVLDL   HEMOGLOBIN A1C No results found for: HGBA1C, MPG TSH No results for input(s): TSH in the last 8760 hours.  External labs:  Labs on 03/17/2021 Lipid Panel: LDL 160, HDL 65, Triglycerides 93, Total Cholesterol 244 CMP: Sodium 142, Potassium 4.5, Glucose 107, Creatinine 0.96, ALT 73, AST 36, eGFR 66 CBC: Hemoglobin 15.4, Hematocrit 46.3, MCV 95.9, Platelets 294, CBC WNL TSH: 1.39  Medications and allergies   Allergies  Allergen Reactions   Penicillins      Medication prior to this encounter:   Outpatient Medications Prior to Visit  Medication Sig Dispense Refill   betamethasone valerate (VALISONE) 0.1 % cream 1 application to affected area     clorazepate (TRANXENE) 7.5 MG tablet TAKE 1 CAPSULE TWICE DAILY TUESDAY THRU FRIDAY AND IF NEEDED ON OTHER DAYS. 108 tablet 1   cyclobenzaprine (FLEXERIL) 10 MG tablet Take 10 mg by mouth as needed.  0    fluocinonide ointment (LIDEX) 6.29 % Apply 1 application topically 2 (two) times daily.     IBUPROFEN PO Take by mouth as needed (takes 1-2 4-5x week. (660m tablet)).      metoprolol tartrate (LOPRESSOR) 25 MG tablet Take 1 tablet by mouth daily at 6 (six) AM.     Multiple Vitamins-Minerals (MULTIVITAMIN PO) Take by mouth daily.     omeprazole (PRILOSEC) 20 MG capsule Take 20 mg by mouth daily.     ondansetron (ZOFRAN-ODT) 4 MG disintegrating tablet TAKE 1 TABLET EVERY 6 HRS AS NEEDED  1   PARoxetine (PAXIL) 20 MG tablet Take 1 tablet (20 mg total) by mouth daily. 90 tablet 3   primidone (MYSOLINE) 50 MG tablet TAKE 1 TABLET BY MOUTH  DAILY AND AN ADDITIONAL 1/2 TABLET DAILY AS NEEDED 135 tablet 4   saccharomyces boulardii (FLORASTOR) 250 MG capsule Take 250 mg by mouth daily.      No facility-administered medications prior to visit.     Medication list after today's encounter   Current Outpatient Medications  Medication Instructions   aspirin (ASPIRIN CHILDRENS) 81 mg, Oral, Daily   betamethasone valerate (VALISONE) 0.1 % cream 1 application to affected area   clorazepate (TRANXENE) 7.5 MG tablet TAKE 1 CAPSULE TWICE DAILY TUESDAY THRU FRIDAY AND IF NEEDED ON OTHER DAYS.   cyclobenzaprine (FLEXERIL) 10 mg, Oral, As needed   fluocinonide ointment (LIDEX) 04.76% 1 application, Topical, 2 times daily   IBUPROFEN PO Oral, As needed   metoprolol tartrate (LOPRESSOR) 25 MG tablet 1 tablet, Oral, Daily   Multiple Vitamins-Minerals (MULTIVITAMIN PO) Oral, Daily   nitroGLYCERIN (NITROSTAT) 0.4 mg, Sublingual, Every 5 min PRN   omeprazole (PRILOSEC) 20 mg, Daily   ondansetron (ZOFRAN-ODT) 4 MG disintegrating tablet TAKE 1 TABLET EVERY 6 HRS AS NEEDED   PARoxetine (PAXIL) 20 mg, Oral, Daily   primidone (MYSOLINE) 50 MG tablet TAKE 1 TABLET BY MOUTH  DAILY AND AN ADDITIONAL 1/2 TABLET DAILY AS NEEDED   rosuvastatin (CRESTOR) 20 mg, Oral, Daily   saccharomyces boulardii (FLORASTOR) 250 mg, Oral,  Daily  Radiology:   No results found.  Cardiac Studies:   Treadmill Stress 07/20/2014:  Indications: Assessment of Chest Pain. Conclusions: Negative for ischemia. The patient exercised according to the [facility]   protocol, Total time recorded    7    Min.     6    sec.  achieving a max heart rate of 174  which was 101% of MPHR for age and  7.3 METS of work. Baseline NIBP was 112/80. Peak NIBP was 112/80 MaxSysp was: 168 MaxDiasp was: 80. The baseline ECG showed NSR,Normal ECG. During exercise there was no ST-T changes of ischemia. Symptoms: Dyspnea, Chest pressure, MPHR met (100%). Arrhythmia: None. Evaluate for non cardiac chest pain. If further suspicion for CAD, consider nuclear stress test.  Exercise Myoview stress test 03/19/2008: Patient exercised for 7:15  minutes, 9 METS no evidence of ischemia.  Normal left ventricular systolic function.  Nocturnal oximetry 07/02/2014: Normal study.  No significant hypoxemia.  Echocardiogram 07/23/2014: Left ventricle cavity is normal in size. Mild concentric hypertrophy of the left ventricle. Normal global wall motion. Normal diastolic filling pattern. Calculated EF 68%. Right atrial cavity is mildly dilated. Interatrial septum bows to the left and suggests elevated right atrial pressure.  A small PFO cannot be completely excluded (probably of no clinical significance) Right ventricle cavity is mildly dilated. Normal right ventricular function. Mild tricuspid regurgitation. No evidence of pulmonary hypertension.  CT Angio Chest 03/11/2012:  Aorta normal caliber without aneurysm or dissection.  Few coronary serial calcifications noted.  Beam hardening artifacts from dense contrast within SVC.  Pulmonary arteries patent. No evidence of pulmonary embolism.  Visualized portion of upper abdomen unremarkable.  EKG:   EKG 04/29/2021: Normal sinus rhythm with rate of 67 bpm, normal axis.  No evidence of ischemia, normal EKG.    Assessment      ICD-10-CM   1. Angina pectoris (HCC)  I20.9 EKG 12-Lead    nitroGLYCERIN (NITROSTAT) 0.4 MG SL tablet    PCV ECHOCARDIOGRAM COMPLETE    PCV MYOCARDIAL PERFUSION WO LEXISCAN    aspirin (ASPIRIN CHILDRENS) 81 MG chewable tablet    2. Dyspnea on exertion  R06.00 Ambulatory referral to Sleep Studies    3. Coronary artery calcification  I25.10    I25.84     4. Primary hypertension  I10     5. Hypercholesteremia  E78.00 rosuvastatin (CRESTOR) 20 MG tablet    6. Chronic fatigue and malaise  R53.82 Ambulatory referral to Sleep Studies   R53.81        There are no discontinued medications.  Meds ordered this encounter  Medications   nitroGLYCERIN (NITROSTAT) 0.4 MG SL tablet    Sig: Place 1 tablet (0.4 mg total) under the tongue every 5 (five) minutes as needed for up to 25 days for chest pain.    Dispense:  25 tablet    Refill:  3   rosuvastatin (CRESTOR) 20 MG tablet    Sig: Take 1 tablet (20 mg total) by mouth daily.    Dispense:  30 tablet    Refill:  2   aspirin (ASPIRIN CHILDRENS) 81 MG chewable tablet    Sig: Chew 1 tablet (81 mg total) by mouth daily.    Orders Placed This Encounter  Procedures   Ambulatory referral to Sleep Studies    Referral Priority:   Routine    Referral Type:   Consultation    Referral Reason:   Specialty Services Required    Referred to Provider:   Dohmeier,  Asencion Partridge, MD    Number of Visits Requested:   1   PCV MYOCARDIAL PERFUSION WO LEXISCAN    Standing Status:   Future    Standing Expiration Date:   06/30/2021   EKG 12-Lead   PCV ECHOCARDIOGRAM COMPLETE    Standing Status:   Future    Standing Expiration Date:   04/29/2022   Recommendations:   BECKETT HICKMON is a 55 y.o. caucasian female with past medical history significant for mild cardiomyopathy on echo, hypertension, hyperlipidemia, benign essential tremor, and anxiety. The patient presents today for evaluation of chest pain, SOB, and hyperlipidemia.   The patient's presentation today  is concerning for angina. Echo and stress test in 2015 were not concerning for any acute abnormalities or ischemia at the time. EKG today also does not show any evidence of ischemic changes. However upon further evaluation of the patient's chart, CT Angio of the chest on 03/11/2012 did note coronary calcifications. Due to this finding paired with the patient's recent symptomatology concerning for angina and her elevated cholesterol, nitroglycerin PRN and Aspirin 81 mg prescriptions were sent. Echocardiogram and nuclear stress test were also ordered to further evaluate the patient's symptoms.   The patient should continue taking Metoprolol tartrate 25 mg for her HTN since her BP is well controlled in the office today at 115/78. Will continue to monitor. The patient was educated on how administration of Nitroglycerin PRN may make her blood pressure drop. She was instructed to rest and drink plenty of fluids if this occurs.   To evaluate the patient's fatigue and daytime sleepiness, a referral was made for the patient to receive a sleep study.   To address the patient's elevated lipids, prescription for Rosuvastatin 20 mg was sent.   The patient's heart rate is within normal limits in the office today at 75 bpm. No arrhythmias seen on EKG. Metoprolol should continue to help with the patient's heart rate. Will continue to monitor the patient's concern about her elevated pulse rate.   The patient's 10 pound weight gain this year is likely due to her increased eating since her gastroparesis is better. Diet modification encouraged.    Follow up in 6 weeks for angina pectoris, hypertension and hyperlipidemia, follow-up sleep study.   [place], PA-S 04/30/2021, 3:57 PM Office: [phone]  Patient seen and examined in conjunction with Ms. Adline Potter, [place] second year Ship broker at [employer].  Time spent is in direct patient face to face encounter not including the teaching and training involved.     Adrian Prows, MD, [facility] 04/30/2021, 4:02 PM Office: [phone]

## 2021-05-13 ENCOUNTER — Ambulatory Visit: Payer: 59

## 2021-05-13 ENCOUNTER — Other Ambulatory Visit: Payer: Self-pay

## 2021-05-13 DIAGNOSIS — I209 Angina pectoris, unspecified: Secondary | ICD-10-CM

## 2021-05-16 ENCOUNTER — Other Ambulatory Visit: Payer: 59

## 2021-05-23 ENCOUNTER — Other Ambulatory Visit: Payer: Self-pay

## 2021-05-23 ENCOUNTER — Ambulatory Visit: Payer: 59

## 2021-05-23 DIAGNOSIS — I209 Angina pectoris, unspecified: Secondary | ICD-10-CM

## 2021-05-23 LAB — PCV MYOCARDIAL PERFUSION WO LEXISCAN: MPHR: 165 {beats}/min

## 2021-06-09 ENCOUNTER — Encounter (HOSPITAL_COMMUNITY): Payer: Self-pay | Admitting: Emergency Medicine

## 2021-06-09 ENCOUNTER — Emergency Department (HOSPITAL_COMMUNITY)
Admission: EM | Admit: 2021-06-09 | Discharge: 2021-06-09 | Disposition: A | Discharge status: Home / Self Care | Payer: 59 | Attending: Student | Admitting: Student

## 2021-06-09 ENCOUNTER — Ambulatory Visit: Payer: 59 | Admitting: Student

## 2021-06-09 ENCOUNTER — Encounter: Payer: Self-pay | Admitting: Student

## 2021-06-09 ENCOUNTER — Emergency Department (HOSPITAL_COMMUNITY): Payer: 59

## 2021-06-09 ENCOUNTER — Other Ambulatory Visit: Payer: Self-pay

## 2021-06-09 VITALS — BP 184/107 | HR 66 | Temp 98.0°F | Resp 17 | Ht 62.0 in | Wt 207.0 lb

## 2021-06-09 DIAGNOSIS — Z79899 Other long term (current) drug therapy: Secondary | ICD-10-CM | POA: Insufficient documentation

## 2021-06-09 DIAGNOSIS — R4182 Altered mental status, unspecified: Secondary | ICD-10-CM

## 2021-06-09 DIAGNOSIS — I10 Essential (primary) hypertension: Secondary | ICD-10-CM | POA: Diagnosis not present

## 2021-06-09 DIAGNOSIS — R42 Dizziness and giddiness: Secondary | ICD-10-CM

## 2021-06-09 DIAGNOSIS — R0602 Shortness of breath: Secondary | ICD-10-CM | POA: Insufficient documentation

## 2021-06-09 DIAGNOSIS — Z7982 Long term (current) use of aspirin: Secondary | ICD-10-CM | POA: Diagnosis not present

## 2021-06-09 DIAGNOSIS — I209 Angina pectoris, unspecified: Secondary | ICD-10-CM

## 2021-06-09 DIAGNOSIS — I160 Hypertensive urgency: Secondary | ICD-10-CM

## 2021-06-09 DIAGNOSIS — R4781 Slurred speech: Secondary | ICD-10-CM

## 2021-06-09 LAB — CBC WITH DIFFERENTIAL/PLATELET
Abs Immature Granulocytes: 0.04 10*3/uL (ref 0.00–0.07)
Basophils Absolute: 0 10*3/uL (ref 0.0–0.1)
Basophils Relative: 0 %
Eosinophils Absolute: 0.1 10*3/uL (ref 0.0–0.5)
Eosinophils Relative: 2 %
HCT: 43.2 % (ref 36.0–46.0)
Hemoglobin: 14.6 g/dL (ref 12.0–15.0)
Immature Granulocytes: 0 %
Lymphocytes Relative: 26 %
Lymphs Abs: 2.4 10*3/uL (ref 0.7–4.0)
MCH: 32 pg (ref 26.0–34.0)
MCHC: 33.8 g/dL (ref 30.0–36.0)
MCV: 94.7 fL (ref 80.0–100.0)
Monocytes Absolute: 0.7 10*3/uL (ref 0.1–1.0)
Monocytes Relative: 8 %
Neutro Abs: 5.7 10*3/uL (ref 1.7–7.7)
Neutrophils Relative %: 64 %
Platelets: 304 10*3/uL (ref 150–400)
RBC: 4.56 MIL/uL (ref 3.87–5.11)
RDW: 13.1 % (ref 11.5–15.5)
WBC: 9 10*3/uL (ref 4.0–10.5)
nRBC: 0 % (ref 0.0–0.2)

## 2021-06-09 LAB — URINALYSIS, ROUTINE W REFLEX MICROSCOPIC
Bilirubin Urine: NEGATIVE
Glucose, UA: NEGATIVE mg/dL
Hgb urine dipstick: NEGATIVE
Ketones, ur: 5 mg/dL — AB
Leukocytes,Ua: NEGATIVE
Nitrite: NEGATIVE
Protein, ur: NEGATIVE mg/dL
Specific Gravity, Urine: 1.009 (ref 1.005–1.030)
pH: 7 (ref 5.0–8.0)

## 2021-06-09 LAB — COMPREHENSIVE METABOLIC PANEL
ALT: 30 U/L (ref 0–44)
AST: 25 U/L (ref 15–41)
Albumin: 4.1 g/dL (ref 3.5–5.0)
Alkaline Phosphatase: 68 U/L (ref 38–126)
Anion gap: 6 (ref 5–15)
BUN: 14 mg/dL (ref 6–20)
CO2: 26 mmol/L (ref 22–32)
Calcium: 9.9 mg/dL (ref 8.9–10.3)
Chloride: 109 mmol/L (ref 98–111)
Creatinine, Ser: 0.75 mg/dL (ref 0.44–1.00)
GFR, Estimated: >60 mL/min (ref >60)
Glucose, Bld: 97 mg/dL (ref 70–99)
Potassium: 4 mmol/L (ref 3.5–5.1)
Sodium: 141 mmol/L (ref 135–145)
Total Bilirubin: 0.7 mg/dL (ref 0.3–1.2)
Total Protein: 7.4 g/dL (ref 6.5–8.1)

## 2021-06-09 LAB — TSH: TSH: 1.473 u[IU]/mL (ref 0.350–4.500)

## 2021-06-09 LAB — TROPONIN I (HIGH SENSITIVITY)
Troponin I (High Sensitivity): 2 ng/L (ref <18)
Troponin I (High Sensitivity): 3 ng/L (ref <18)

## 2021-06-09 LAB — CBG MONITORING, ED: Glucose-Capillary: 101 mg/dL — ABNORMAL HIGH (ref 70–99)

## 2021-06-09 MED ORDER — SODIUM CHLORIDE 0.9 % IV BOLUS
1000.0000 mL | Freq: Once | INTRAVENOUS | Status: AC
  Administered 2021-06-09: 1000 mL via INTRAVENOUS

## 2021-06-09 MED ORDER — IOHEXOL 350 MG/ML SOLN: 75.0000 mL | Freq: Once | INTRAVENOUS | Status: DC | PRN

## 2021-06-09 MED ORDER — ONDANSETRON HCL 4 MG/2ML IJ SOLN
4.0000 mg | Freq: Once | INTRAMUSCULAR | Status: AC
  Administered 2021-06-09: 4 mg via INTRAVENOUS
  Filled 2021-06-09: qty 2

## 2021-06-09 MED ORDER — OLMESARTAN MEDOXOMIL-HCTZ 20-12.5 MG PO TABS: 1.0000 | ORAL_TABLET | Freq: Every day | ORAL | 0 refills | Status: DC

## 2021-06-09 MED ORDER — IOHEXOL 350 MG/ML SOLN
100.0000 mL | Freq: Once | INTRAVENOUS | Status: AC | PRN
  Administered 2021-06-09: 100 mL via INTRAVENOUS

## 2021-06-09 NOTE — ED Notes (Signed)
Pt was able to ambulate through the Hospital without any pain or weakness, Also Pt was alert and orientated which was able to give feedback as she was walking.  Husband has been fully supported during the walk.

## 2021-06-09 NOTE — Progress Notes (Signed)
Primary Physician/Referring:  Deland Pretty, MD  Patient ID: Jamie Trujillo, female    DOB: 09-19-1966, 55 y.o.   MRN: [id number]  Chief Complaint  Patient presents with   Follow-up   HPI:    Jamie Trujillo  is a 55 y.o. caucasian female with past medical history significant for mild cardiomyopathy on echo, hypertension, hyperlipidemia, benign essential tremor, and anxiety. The patient presents today for evaluation of chest pain, SOB, and hyperlipidemia.   Patient presents for urgent visit with concerns of high blood pressure, chest pressure, slurred speech, and confusion.  Patient is accompanied by her coworker who assists in providing history.  At approximately 10 AM this morning patient's coworker found her to be pale and dizzy, patient was also experiencing mild confusion as well as slurred speech.  At that time they checked patient's blood pressure and it was elevated at 160/102 mmHg and heart rate 102 bpm.  Given patient's chest pressure as well as shortness of breath she took aspirin chewable 81 mg at 10:30 AM and again at 10:15 AM.  She also took her metoprolol tartrate this morning.  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  She reports near syncope, no loss of consciousness.  Past Medical History:  Diagnosis Date   Anxiety    Depression    Essential tremor    Heart murmur 2009   Past Surgical History:  Procedure Laterality Date   FINGER SURGERY  1990   GALLBLADDER SURGERY  10/02/2001   Removed    Family History  Problem Relation Age of Onset   Heart disease Mother    Heart failure Mother    Atrial fibrillation Mother    Heart disease Father    Heart attack Brother    Heart disease Brother     Social History   Tobacco Use   Smoking status: Never   Smokeless tobacco: Never  Substance Use Topics    Alcohol use: Yes    Comment: Occ on weekends    Marital Status: Married  ROS  Review of Systems  Constitutional: Positive for malaise/fatigue and weight gain.  Cardiovascular:  Positive for chest pain, dyspnea on exertion and near-syncope. Negative for claudication, leg swelling and syncope.  Respiratory:  Negative for sleep disturbances due to breathing.   Musculoskeletal:  Positive for back pain.  Neurological:  Positive for disturbances in coordination, excessive daytime sleepiness and dizziness.       Confusion and slurred speech  Psychiatric/Behavioral:         Stress  Objective  Blood pressure (!) 184/107, pulse 66, temperature 98 F (36.7 C), temperature source Temporal, resp. rate 17, height '5\' 2"'  (1.575 m), weight 207 lb (93.9 kg), SpO2 99 %. Body mass index is 37.86 kg/m.  Vitals with BMI 06/09/2021 06/09/2021 06/09/2021  Height - - -  Weight - - -  BMI - - -  Systolic 073 710 626  Diastolic 948 546 270  Pulse 66 66 70    Physical Exam Vitals reviewed.  Constitutional:      Appearance: She is obese.     Comments: Patient appears fatigued  Neck:     Vascular: No carotid bruit or hepatojugular reflux.  Cardiovascular:     Rate and Rhythm: Normal rate and regular rhythm.     Pulses: Normal pulses.  Heart sounds: Normal heart sounds. No murmur heard.   No gallop.  Pulmonary:     Effort: Pulmonary effort is normal. No respiratory distress.     Breath sounds: Normal breath sounds. No wheezing, rhonchi or rales.  Musculoskeletal:     Right lower leg: No edema.     Left lower leg: No edema.  Neurological:     General: No focal deficit present.     Mental Status: She is alert and oriented to person, place, and time.     Cranial Nerves: No cranial nerve deficit.     Motor: No weakness.  Psychiatric:        Mood and Affect: Mood normal.        Behavior: Behavior normal.     Laboratory examination:   No results for input(s): NA, K, CL, CO2, GLUCOSE, BUN, CREATININE,  CALCIUM, GFRNONAA, GFRAA in the last 8760 hours. CrCl cannot be calculated (Patient's most recent lab result is older than the maximum 21 days allowed.).  CMP 03/18/2008  Glucose 135(H)  BUN 13  Creatinine 0.9  Sodium 138  Potassium 4.0  Chloride 103   CBC 03/18/2008 03/18/2008  WBC - 8.1  Hemoglobin 15.6(H) 14.8  Hematocrit 46.0 42.4  Platelets - 339   Lipid Panel No results for input(s): CHOL, TRIG, LDLCALC, VLDL, HDL, CHOLHDL, LDLDIRECT in the last 8760 hours. Lipid Panel  No results found for: CHOL, TRIG, HDL, CHOLHDL, VLDL, LDLCALC, LDLDIRECT, LABVLDL   HEMOGLOBIN A1C No results found for: HGBA1C, MPG TSH No results for input(s): TSH in the last 8760 hours.  External labs:  Labs on 03/17/2021 Lipid Panel: LDL 160, HDL 65, Triglycerides 93, Total Cholesterol 244 CMP: Sodium 142, Potassium 4.5, Glucose 107, Creatinine 0.96, ALT 73, AST 36, eGFR 66 CBC: Hemoglobin 15.4, Hematocrit 46.3, MCV 95.9, Platelets 294, CBC WNL TSH: 1.39  Medications and allergies   Allergies  Allergen Reactions   Penicillins      Medication prior to this encounter:   Outpatient Medications Prior to Visit  Medication Sig Dispense Refill   aspirin (ASPIRIN CHILDRENS) 81 MG chewable tablet Chew 1 tablet (81 mg total) by mouth daily.     betamethasone valerate (VALISONE) 0.1 % cream 1 application to affected area     clorazepate (TRANXENE) 7.5 MG tablet TAKE 1 CAPSULE TWICE DAILY TUESDAY THRU FRIDAY AND IF NEEDED ON OTHER DAYS. 108 tablet 1   cyclobenzaprine (FLEXERIL) 10 MG tablet Take 10 mg by mouth as needed.  0   fluocinonide ointment (LIDEX) 7.35 % Apply 1 application topically 2 (two) times daily.     IBUPROFEN PO Take by mouth as needed (takes 1-2 4-5x week. (636m tablet)).      metoprolol tartrate (LOPRESSOR) 25 MG tablet Take 1 tablet by mouth 2 (two) times daily.     Multiple Vitamins-Minerals (MULTIVITAMIN PO) Take by mouth daily.     nitroGLYCERIN (NITROSTAT) 0.4 MG SL tablet Place  1 tablet (0.4 mg total) under the tongue every 5 (five) minutes as needed for up to 25 days for chest pain. 25 tablet 3   omeprazole (PRILOSEC) 20 MG capsule Take 20 mg by mouth daily.     ondansetron (ZOFRAN-ODT) 4 MG disintegrating tablet TAKE 1 TABLET EVERY 6 HRS AS NEEDED  1   PARoxetine (PAXIL) 20 MG tablet Take 1 tablet (20 mg total) by mouth daily. 90 tablet 3   primidone (MYSOLINE) 50 MG tablet TAKE 1 TABLET BY MOUTH  DAILY AND AN ADDITIONAL 1/2 TABLET DAILY  AS NEEDED 135 tablet 4   rosuvastatin (CRESTOR) 20 MG tablet Take 1 tablet (20 mg total) by mouth daily. 30 tablet 2   saccharomyces boulardii (FLORASTOR) 250 MG capsule Take 250 mg by mouth daily.      No facility-administered medications prior to visit.     Medication list after today's encounter   Current Outpatient Medications  Medication Instructions   aspirin (ASPIRIN CHILDRENS) 81 mg, Oral, Daily   betamethasone valerate (VALISONE) 0.1 % cream 1 application to affected area   clorazepate (TRANXENE) 7.5 MG tablet TAKE 1 CAPSULE TWICE DAILY TUESDAY THRU FRIDAY AND IF NEEDED ON OTHER DAYS.   cyclobenzaprine (FLEXERIL) 10 mg, Oral, As needed   fluocinonide ointment (LIDEX) 1.28 % 1 application, Topical, 2 times daily   IBUPROFEN PO Oral, As needed   metoprolol tartrate (LOPRESSOR) 25 MG tablet 1 tablet, Oral, 2 times daily   Multiple Vitamins-Minerals (MULTIVITAMIN PO) Oral, Daily   nitroGLYCERIN (NITROSTAT) 0.4 mg, Sublingual, Every 5 min PRN   omeprazole (PRILOSEC) 20 mg, Daily   ondansetron (ZOFRAN-ODT) 4 MG disintegrating tablet TAKE 1 TABLET EVERY 6 HRS AS NEEDED   PARoxetine (PAXIL) 20 mg, Oral, Daily   primidone (MYSOLINE) 50 MG tablet TAKE 1 TABLET BY MOUTH  DAILY AND AN ADDITIONAL 1/2 TABLET DAILY AS NEEDED   rosuvastatin (CRESTOR) 20 mg, Oral, Daily   saccharomyces boulardii (FLORASTOR) 250 mg, Oral, Daily    Radiology:   No results found.  Cardiac Studies:   Treadmill Stress 07/20/2014:  Indications:  Assessment of Chest Pain. Conclusions: Negative for ischemia. The patient exercised according to the [facility]   protocol, Total time recorded    7    Min.     6    sec.  achieving a max heart rate of 174  which was 101% of MPHR for age and  7.3 METS of work. Baseline NIBP was 112/80. Peak NIBP was 112/80 MaxSysp was: 168 MaxDiasp was: 80. The baseline ECG showed NSR,Normal ECG. During exercise there was no ST-T changes of ischemia. Symptoms: Dyspnea, Chest pressure, MPHR met (100%). Arrhythmia: None. Evaluate for non cardiac chest pain. If further suspicion for CAD, consider nuclear stress test.  Exercise Myoview stress test 03/19/2008: Patient exercised for 7:15  minutes, 9 METS no evidence of ischemia.  Normal left ventricular systolic function.  Nocturnal oximetry 07/02/2014: Normal study.  No significant hypoxemia.  Echocardiogram 07/23/2014: Left ventricle cavity is normal in size. Mild concentric hypertrophy of the left ventricle. Normal global wall motion. Normal diastolic filling pattern. Calculated EF 68%. Right atrial cavity is mildly dilated. Interatrial septum bows to the left and suggests elevated right atrial pressure.  A small PFO cannot be completely excluded (probably of no clinical significance) Right ventricle cavity is mildly dilated. Normal right ventricular function. Mild tricuspid regurgitation. No evidence of pulmonary hypertension.  CT Angio Chest 03/11/2012:  Aorta normal caliber without aneurysm or dissection.  Few coronary serial calcifications noted.  Beam hardening artifacts from dense contrast within SVC.  Pulmonary arteries patent. No evidence of pulmonary embolism.  Visualized portion of upper abdomen unremarkable.  EKG:  06/09/2021: Normal sinus rhythm at a rate of 60 bpm.  Normal axis.  No evidence of ischemia or underlying injury pattern.  EKG 04/29/2021: Normal sinus rhythm with rate of 67 bpm, normal axis.  No evidence of ischemia, normal EKG.    Assessment      ICD-10-CM   1. Angina pectoris ([place])  I20.9 EKG 12-Lead    2. Hypertensive urgency  I16.0     3. Altered mental status, unspecified altered mental status type  R41.82     4. Slurred speech  R47.81        There are no discontinued medications.  No orders of the defined types were placed in this encounter.   Orders Placed This Encounter  Procedures   EKG 12-Lead   Recommendations:   LORAY AKARD is a 55 y.o. caucasian female with past medical history significant for mild cardiomyopathy on echo, hypertension, hyperlipidemia, benign essential tremor, and anxiety. The patient presents today for evaluation of chest pain, SOB, and hyperlipidemia.   Patient presents for urgent visit with concerns of slurred speech, confusion, dizziness and near syncope, as well as chest pressure starting this morning.  Upon further questioning patient has had intermittent episodes of slurred speech over the last 2 weeks.  Patient's blood pressure is quite elevated at 167/107 mmHg.  Patient's blood pressure in left and right arm are equivalent.  EKG is without acute ischemic changes.  However given patient's concerning neurological symptoms and elevated blood pressure as well as chest pressure recommend patient seek immediate further evaluation in the emergency department.  Patient will be transported via EMS to emergency department for further evaluation and management.  Recommend further evaluation for stroke as well as consider evaluation for aortic dissection.  Further follow-up and recommendations pending evaluation in the emergency department.  Patient was seen in collaboration with Dr. Einar Gip. He also reviewed patient's chart and examined the patient. Dr. Einar Gip is in agreement of the plan.    Alethia Berthold, PA-C 06/09/2021, 12:09 PM Office: [phone]

## 2021-06-09 NOTE — ED Triage Notes (Signed)
BIBA Per EMS: Pt coming from Dr office with c/o dizziness, weakness, and hypertension. Pt experienced some chest pressure as well. Chest pressure has been going on x2weeks. Pt takes metoprolol. Vitals:  186/94 72HR  17 RR 101 CBG  99% RA

## 2021-06-09 NOTE — Discharge Instructions (Addendum)
I am prescribing you a new medication for your blood pressure.  It is olmesartan-hydrochlorothiazide.  I want you to take this once per day in the morning.  I have given you a 2-week supply which should bridge you until you can follow-up with cardiology.  Dr. Jacinto Halim would also like you to double your metoprolol to 50 mg twice per day.  Please reach out to his office tomorrow to schedule an appointment for reevaluation.  If you develop new or worsening symptoms please come back to the emergency department immediately.  It was a pleasure to meet you both.

## 2021-06-09 NOTE — ED Provider Notes (Signed)
Patient is a 55 year old female whose care was transferred to me at shift change from [facility].  Her HPI is below:  Jamie Trujillo is a 55 y.o. female with a past medical history of anxiety, HTN, presenting to the ED with a chief complaint of dizziness, chest pressure, shortness of breath.  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  She has had been experiencing extreme fatigue.  She had an annual physical by her PCP in July.  States that she was "extremely concerned about me" and sent her to the cardiologist.  She has since been started on metoprolol for her high blood pressure, baby aspirin and Crestor.  She underwent an echo, nuclear stress test and EKG by her cardiologist.  She was told that she had an EF about 48% on recent testing but has not yet had a chance to discuss further recommendations with her cardiologist.  She woke up in her usual state of health this morning.  However when she got to work around 10 AM she started experiencing dizziness.  She describes this as "feeling off balance when I walk."  This is new for her.  Her coworker told her that she looked extremely pale.  When she checked her blood pressure it was elevated to 160 systolic.  She denies any specific confusion or slurred speech but states that "I have just been so fatigued so maybe that is why."  Her chest pressure and shortness of breath has been intermittent for several months.  She has had a headache the past few days but no headache today and she feels that this is more related because she forgets to eat until the end of the night.  She denies any leg swelling, numbness in arms or legs, blurry vision or loss of vision, prior stroke or MI, anticoagulant use.  She took her metoprolol this morning.  She only ate a few bites of lemon cake and drink a little bit of coffee this morning.  Physical Exam  BP (!) 182/113   Pulse 74   Temp 98.2 F (36.8 C) (Oral)    Resp 11   SpO2 95%   Physical Exam Vitals and nursing note reviewed.  Constitutional:      General: She is not in acute distress.    Appearance: She is well-developed.  HENT:     Head: Normocephalic and atraumatic.     Nose: Nose normal.  Eyes:     General: No scleral icterus.       Right eye: No discharge.        Left eye: No discharge.     Conjunctiva/sclera: Conjunctivae normal.     Pupils: Pupils are equal, round, and reactive to light.  Cardiovascular:     Rate and Rhythm: Regular rhythm.     Heart sounds: Normal heart sounds. No murmur heard.   No friction rub. No gallop.  Pulmonary:     Effort: Pulmonary effort is normal. No respiratory distress.     Breath sounds: Normal breath sounds.  Abdominal:     General: Bowel sounds are normal. There is no distension.     Palpations: Abdomen is soft.     Tenderness: There is no abdominal tenderness. There is no guarding.  Musculoskeletal:        General: Normal range of motion.     Cervical back: Normal range of motion and neck supple.  Comments: 2+ DP pulse palpated bilateral lower extremities.  No calf tenderness or edema noted bilaterally.  Skin:    General: Skin is warm and dry.     Findings: No rash.  Neurological:     Mental Status: She is alert and oriented to person, place, and time.     Cranial Nerves: No cranial nerve deficit.     Sensory: No sensory deficit.     Motor: No weakness or abnormal muscle tone.     Coordination: Coordination normal.     Comments: Pupils reactive. No facial asymmetry noted. Cranial nerves appear grossly intact. Sensation intact to light touch on face, BUE and BLE. Strength 5/5 in BUE and BLE.  No pronator drift.  Normal finger-to-nose coordination bilaterally.  ED Course/Procedures   Clinical Course as of 06/09/21 1935  Thu Jun 09, 2021  1423 Glucose-Capillary(!): 101 [HK]  1423 Sodium: 141 [HK]  1423 Hemoglobin: 14.6 [HK]  1423 Troponin I (High Sensitivity): 2 [HK]  1506 TSH:  1.473 [HK]  1907 I spoke to Dr. Jacinto Halim with cardiology.  Had feels that this is likely hypertensive urgency.  He is comfortable with patient being discharged home if she is agreeable.  Recommends that we increase her metoprolol to 50 mg twice daily.  Also recommends that we start patient on olmesartan/HCTZ 20/12.5.  He will schedule an appointment for the patient in the next 2 or 3 days for reevaluation. [LJ]    Clinical Course User Index [HK] Dietrich Pates, PA-C [LJ] Placido Sou, PA-C    Procedures  MDM  Patient is a 55 year old female whose care was transferred to me at shift change.  Please see previous providers note below for further information.  In summary, patient is presenting to the emergency department today due to dizziness.  For the past few months she has been feeling extremely fatigued and complains of intermittent chest pain, shortness of breath, as well as headaches.  She was referred to cardiology by her PCP in July and is since started metoprolol, Crestor, as well as baby aspirin.  While at work this morning she was feeling off balance when ambulating.  She states that this symptom is new for her.  She went to her cardiologist office for this and was sent to the emergency department due to her persistent high blood pressure and symptoms.  Neurological exam appeared reassuring with the previous provider.  At the time of shift change lab work, including a single troponin, is unremarkable.  Patient pending CT scan of the head as well as a CT dissection study.  CT dissection study, CT of the head, as well as chest x-ray are all reassuring.  Second troponin of 3.  I discussed this case with her cardiologist Dr. Jacinto Halim.  Feels that this is likely hypertensive urgency.  Recommends that she double her metoprolol dosing and also recommended that we start her on olmesartan/HCTZ.  This was discussed with the patient and she is amenable.  Ambulated in the emergency department without  assistance.  Feel that she is stable for discharge at this time and she is agreeable.  We discussed return precautions.  Her questions were answered and she was amicable at the time of discharge.       Placido Sou, PA-C 06/09/21 1937    Sloan Leiter, DO 06/10/21 [phone]

## 2021-06-09 NOTE — ED Provider Notes (Signed)
[facility]-EMERGENCY DEPT Provider Note   CSN: [id number] Arrival date & time: 06/09/21  1231     History Chief Complaint  Patient presents with   Dizziness   Hypertension   Weakness   Chest Pain    Jamie Trujillo is a 55 y.o. female with a past medical history of anxiety, HTN, presenting to the ED with a chief complaint of dizziness, chest pressure, shortness of breath.  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  She has had been experiencing extreme fatigue.  She had an annual physical by her PCP in July.  States that she was "extremely concerned about me" and sent her to the cardiologist.  She has since been started on metoprolol for her high blood pressure, baby aspirin and Crestor.  She underwent an echo, nuclear stress test and EKG by her cardiologist.  She was told that she had an EF about 48% on recent testing but has not yet had a chance to discuss further recommendations with her cardiologist.  She woke up in her usual state of health this morning.  However when she got to work around 10 AM she started experiencing dizziness.  She describes this as "feeling off balance when I walk."  This is new for her.  Her coworker told her that she looked extremely pale.  When she checked her blood pressure it was elevated to 160 systolic.  She denies any specific confusion or slurred speech but states that "I have just been so fatigued so maybe that is why."  Her chest pressure and shortness of breath has been intermittent for several months.  She has had a headache the past few days but no headache today and she feels that this is more related because she forgets to eat until the end of the night.  She denies any leg swelling, numbness in arms or legs, blurry vision or loss of vision, prior stroke or MI, anticoagulant use.  She took her metoprolol this morning.  She only ate a few bites of lemon cake and drink a  little bit of coffee this morning.  HPI     Past Medical History:  Diagnosis Date   Anxiety    Depression    Essential tremor    Heart murmur 2009    Patient Active Problem List   Diagnosis Date Noted   Anxiety 07/30/2017   Essential tremor 03/30/2014   Neck muscle strain 03/30/2014    Past Surgical History:  Procedure Laterality Date   FINGER SURGERY  1990   GALLBLADDER SURGERY  10/02/2001   Removed      OB History   No obstetric history on file.     Family History  Problem Relation Age of Onset   Heart disease Mother    Heart failure Mother    Atrial fibrillation Mother    Heart disease Father    Heart attack Brother    Heart disease Brother     Social History   Tobacco Use   Smoking status: Never   Smokeless tobacco: Never  Vaping Use   Vaping Use: Never used  Substance Use Topics   Alcohol use: Yes    Comment: Occ on weekends    Drug use: No    Home Medications Prior to Admission medications   Medication Sig Start Date End Date Taking? Authorizing Provider  aspirin (ASPIRIN CHILDRENS) 81 MG chewable tablet Chew 1  tablet (81 mg total) by mouth daily. 04/29/21   Yates Decamp, MD  betamethasone valerate (VALISONE) 0.1 % cream 1 application to affected area 09/21/14   [provider]  clorazepate (TRANXENE) 7.5 MG tablet TAKE 1 CAPSULE TWICE DAILY TUESDAY THRU FRIDAY AND IF NEEDED ON OTHER DAYS. 02/07/21   Glean Salvo, NP  cyclobenzaprine (FLEXERIL) 10 MG tablet Take 10 mg by mouth as needed. 04/17/16   [provider]  fluocinonide ointment (LIDEX) 0.05 % Apply 1 application topically 2 (two) times daily.    [provider]  IBUPROFEN PO Take by mouth as needed (takes 1-2 4-5x week. (600mg  tablet)).     [provider]  metoprolol tartrate (LOPRESSOR) 25 MG tablet Take 1 tablet by mouth 2 (two) times daily.    [provider]  Multiple Vitamins-Minerals (MULTIVITAMIN PO) Take by mouth daily.    [provider]  nitroGLYCERIN (NITROSTAT) 0.4 MG SL tablet Place 1 tablet (0.4 mg total) under the tongue every 5 (five) minutes as needed for up to 25 days for chest pain. 04/29/21 06/09/21  08/09/21, MD  omeprazole (PRILOSEC) 20 MG capsule Take 20 mg by mouth daily.    [provider]  ondansetron (ZOFRAN-ODT) 4 MG disintegrating tablet TAKE 1 TABLET EVERY 6 HRS AS NEEDED 01/27/15   [provider]  PARoxetine (PAXIL) 20 MG tablet Take 1 tablet (20 mg total) by mouth daily. 02/07/21   04/09/21, NP  primidone (MYSOLINE) 50 MG tablet TAKE 1 TABLET BY MOUTH  DAILY AND AN ADDITIONAL 1/2 TABLET DAILY AS NEEDED 02/07/21   04/09/21, NP  rosuvastatin (CRESTOR) 20 MG tablet Take 1 tablet (20 mg total) by mouth daily. 04/29/21 07/28/21  07/30/21, MD  saccharomyces boulardii (FLORASTOR) 250 MG capsule Take 250 mg by mouth daily.     [provider]    Allergies    Penicillins  Review of Systems   Review of Systems  HENT:  Negative for ear pain, rhinorrhea, sneezing and sore throat.   Respiratory:  Positive for shortness of breath. Negative for cough, chest tightness and wheezing.    Physical Exam Updated Vital Signs BP (!) 182/113   Pulse 74   Temp 98.2 F (36.8 C) (Oral)   Resp 11   SpO2 95%   Physical Exam Vitals and nursing note reviewed.  Constitutional:      General: She is not in acute distress.    Appearance: She is well-developed.  HENT:     Head: Normocephalic and atraumatic.     Nose: Nose normal.  Eyes:     General: No scleral icterus.       Right eye: No discharge.        Left eye: No discharge.     Conjunctiva/sclera: Conjunctivae normal.     Pupils: Pupils are equal, round, and reactive to light.  Cardiovascular:     Rate and Rhythm: Regular rhythm.     Heart sounds: Normal heart sounds. No murmur heard.   No friction rub. No gallop.  Pulmonary:     Effort: Pulmonary effort is normal. No respiratory distress.     Breath sounds:  Normal breath sounds.  Abdominal:     General: Bowel sounds are normal. There is no distension.     Palpations: Abdomen is soft.     Tenderness: There is no abdominal tenderness. There is no guarding.  Musculoskeletal:        General: Normal range of  motion.     Cervical back: Normal range of motion and neck supple.     Comments: 2+ DP pulse palpated bilateral lower extremities.  No calf tenderness or edema noted bilaterally.  Skin:    General: Skin is warm and dry.     Findings: No rash.  Neurological:     Mental Status: She is alert and oriented to person, place, and time.     Cranial Nerves: No cranial nerve deficit.     Sensory: No sensory deficit.     Motor: No weakness or abnormal muscle tone.     Coordination: Coordination normal.     Comments: Pupils reactive. No facial asymmetry noted. Cranial nerves appear grossly intact. Sensation intact to light touch on face, BUE and BLE. Strength 5/5 in BUE and BLE.  No pronator drift.  Normal finger-to-nose coordination bilaterally.    ED Results / Procedures / Treatments   Labs (all labs ordered are listed, but only abnormal results are displayed) Labs Reviewed  URINALYSIS, ROUTINE W REFLEX MICROSCOPIC - Abnormal; Notable for the following components:      Result Value   Ketones, ur 5 (*)    All other components within normal limits  CBG MONITORING, ED - Abnormal; Notable for the following components:   Glucose-Capillary 101 (*)    All other components within normal limits  COMPREHENSIVE METABOLIC PANEL  CBC WITH DIFFERENTIAL/PLATELET  TSH  TROPONIN I (HIGH SENSITIVITY)  TROPONIN I (HIGH SENSITIVITY)    EKG None  Radiology DG Chest 2 View  Result Date: 06/09/2021 CLINICAL DATA:  cp EXAM: CHEST - 2 VIEW COMPARISON:  None. FINDINGS: The cardiomediastinal silhouette is within normal limits. No pleural effusion. No pneumothorax. No mass or consolidation. No acute osseous abnormality. IMPRESSION: No acute abnormality in the  chest. Electronically Signed   By: Olive Bass M.D.   On: 06/09/2021 14:32    Procedures Procedures   Medications Ordered in ED Medications  iohexol (OMNIPAQUE) 350 MG/ML injection 75 mL (has no administration in time range)  sodium chloride 0.9 % bolus 1,000 mL (has no administration in time range)    ED Course  I have reviewed the triage vital signs and the nursing notes.  Pertinent labs & imaging results that were available during my care of the patient were reviewed by me and considered in my medical decision making (see chart for details).  Clinical Course as of 06/09/21 1525  Thu Jun 09, 2021  1423 Glucose-Capillary(!): 101 [HK]  1423 Sodium: 141 [HK]  1423 Hemoglobin: 14.6 [HK]  1423 Troponin I (High Sensitivity): 2 [HK]  1506 TSH: 1.473 [HK]    Clinical Course User Index [HK] Dietrich Pates, PA-C   MDM Rules/Calculators/A&P                           55 year old female presenting to the ED for dizziness.  For the past few months she has been feeling extremely fatigued.  She admits to intermittent chest pain, shortness of breath and headaches.  She was referred to cardiology by her PCP in July.  She has since started metoprolol, Crestor and baby aspirin.  Her symptoms began when she got to work this morning.  Reports feeling "off balance" when she ambulates.  This is a new symptom for her.  She was seen and evaluated at her cardiologist office for the symptoms this morning and sent to the ER due to her persistent high blood pressure and symptoms.  She denies any headache currently, numbness in arms or legs, vision changes, loss of vision.  On exam patient without any neurological deficits.  She has no numbness or weakness on exam.  She has no facial asymmetry.  Equal and intact distal pulses of bilateral lower extremities. Hypertensive here to 170 systolic. Will begin workup and reassess.  CBC, CMP unremarkable.  Troponin is negative x1.  CBG is normal.  Will need CT of the  head, CT dissection study. Will need reassessment after IV hydration.  If she remains symptomatic consider admission vs cardiology consult. Will also need to follow-up on imaging. Care handed off to off to oncoming team.   Portions of this note were generated with Dragon dictation software. Dictation errors may occur despite best attempts at proofreading.  Final Clinical Impression(s) / ED Diagnoses Final diagnoses:  Dizziness    Rx / DC Orders ED Discharge Orders     None        Dietrich PatesKhatri, Tamitha Norell, PA-C 06/09/21 1541    Kom[place], [place], MD 06/09/21 [phone]

## 2021-06-09 NOTE — ED Notes (Signed)
Patient transported to CT 

## 2021-06-10 ENCOUNTER — Other Ambulatory Visit: Payer: Self-pay | Admitting: Student

## 2021-06-10 DIAGNOSIS — I10 Essential (primary) hypertension: Secondary | ICD-10-CM

## 2021-06-10 DIAGNOSIS — R072 Precordial pain: Secondary | ICD-10-CM

## 2021-06-10 NOTE — Progress Notes (Signed)
Spoke with patient following discharge from the emergency department.  She is fatigued and continues to have mild chest pressure this morning.  However her blood pressure is significantly improved and is now in the 120s/80s mmHg.  Discussed with patient proceeding with further ischemic evaluation, she agrees to undergo coronary CTA.  We will also obtain repeat BMP prior to next office visit due to addition of olmesartan/hydrochlorothiazide.  Patient will keep follow-up appointment on 06/17/2021 with Dr. Jacinto Halim.

## 2021-06-13 ENCOUNTER — Telehealth: Payer: Self-pay

## 2021-06-13 MED ORDER — OLMESARTAN MEDOXOMIL-HCTZ 20-12.5 MG PO TABS: 0.5000 | ORAL_TABLET | Freq: Every day | ORAL | 0 refills | Status: DC

## 2021-06-13 NOTE — Telephone Encounter (Signed)
Spoke to patient advised her to cut only olmesartan/hydrochlorothiazide in half, will take 10/6.25 mg once daily.  Advised patient to hold olmesartan/hydrochlorothiazide today start half a tablet once daily tomorrow.  Patient will continue to monitor her blood pressure closely.

## 2021-06-13 NOTE — Telephone Encounter (Signed)
Patient called and stated that her BP has been running low all weekend. She just checked it right now as we speak : 106/73 HR 80.  This morning the when she first woke up and had not yet taken her BP medication, her BP was : 100/67  Friday it was 91/59.  Over the weekend it was 90's/60's. The highest it has been was 112/60-70's  She has questions and concerns about possible medication changes, and would like a call.

## 2021-06-17 ENCOUNTER — Other Ambulatory Visit: Payer: Self-pay

## 2021-06-17 ENCOUNTER — Encounter: Payer: Self-pay | Admitting: Cardiology

## 2021-06-17 ENCOUNTER — Ambulatory Visit: Payer: 59 | Admitting: Cardiology

## 2021-06-17 VITALS — BP 118/73 | HR 70 | Temp 98.7°F | Resp 16 | Ht 62.0 in | Wt 206.2 lb

## 2021-06-17 DIAGNOSIS — I10 Essential (primary) hypertension: Secondary | ICD-10-CM

## 2021-06-17 DIAGNOSIS — I2584 Coronary atherosclerosis due to calcified coronary lesion: Secondary | ICD-10-CM

## 2021-06-17 DIAGNOSIS — R5381 Other malaise: Secondary | ICD-10-CM

## 2021-06-17 DIAGNOSIS — R072 Precordial pain: Secondary | ICD-10-CM

## 2021-06-17 DIAGNOSIS — E7800 Pure hypercholesterolemia, unspecified: Secondary | ICD-10-CM

## 2021-06-17 DIAGNOSIS — I2510 Atherosclerotic heart disease of native coronary artery without angina pectoris: Secondary | ICD-10-CM

## 2021-06-17 DIAGNOSIS — R5383 Other fatigue: Secondary | ICD-10-CM

## 2021-06-17 LAB — BASIC METABOLIC PANEL
BUN/Creatinine Ratio: 17 (ref 9–23)
BUN: 17 mg/dL (ref 6–24)
CO2: 24 mmol/L (ref 20–29)
Calcium: 10.1 mg/dL (ref 8.7–10.2)
Chloride: 99 mmol/L (ref 96–106)
Creatinine, Ser: 0.98 mg/dL (ref 0.57–1.00)
Glucose: 92 mg/dL (ref 65–99)
Potassium: 5 mmol/L (ref 3.5–5.2)
Sodium: 142 mmol/L (ref 134–144)
eGFR: 68 mL/min/{1.73_m2} (ref >59)

## 2021-06-17 MED ORDER — OLMESARTAN MEDOXOMIL-HCTZ 20-12.5 MG PO TABS
0.5000 | ORAL_TABLET | Freq: Every day | ORAL | 3 refills | Status: DC
Start: 2021-06-17 — End: 2021-07-01

## 2021-06-17 MED ORDER — METOPROLOL TARTRATE 25 MG PO TABS: 25.0000 mg | ORAL_TABLET | Freq: Two times a day (BID) | ORAL | 1 refills | Status: DC

## 2021-06-17 NOTE — Progress Notes (Signed)
Seeing you today

## 2021-06-17 NOTE — Progress Notes (Signed)
Primary Physician/Referring:  Deland Pretty, MD  Patient ID: Jamie Trujillo, female    DOB: 03/28/1966, 55 y.o.   MRN: [id number]  Chief Complaint  Patient presents with   Chest Pain   Hypertension   Hyperlipidemia   Follow-up   HPI:    Jamie Trujillo  is a 55 y.o. 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 She was seen emergently on 06/09/2021, 2 weeks ago for chest pain, shortness of breath, hypertensive urgency and brain fogginess and was sent to the emergency room.    In the ED she was found to be negative for dissection of the aorta and also cardiac markers were negative for injury with no EKG abnormalities, she was discharged home with changes made to her blood pressure medications.  She now presents for follow-up.  States that she has not had any further dizziness or fogginess in her brain, has had occasional chest tightness but very mild.  She continues to have extreme stress at work.  Continues to feel markedly fatigued, dyspnea has remained stable, no leg edema, no PND or orthopnea.  Past Medical History:  Diagnosis Date   Anxiety    Depression    Essential tremor    Heart murmur 2009   Past Surgical History:  Procedure Laterality Date   FINGER SURGERY  1990   GALLBLADDER SURGERY  10/02/2001   Removed    Family History  Problem Relation Age of Onset   Heart disease Mother    Heart failure Mother    Atrial fibrillation Mother    Heart disease Father    Heart attack Brother    Heart disease Brother     Social History   Tobacco Use   Smoking status: Never   Smokeless tobacco: Never  Substance Use Topics   Alcohol use: Yes    Comment: Occ on weekends    Marital Status: Married  ROS  Review of Systems  Constitutional: Positive for malaise/fatigue and weight gain.  Cardiovascular:  Positive for chest pain  and dyspnea on exertion. Negative for claudication, leg swelling, near-syncope and syncope.  Respiratory:  Negative for sleep disturbances due to breathing.   Musculoskeletal:  Positive for back pain.  Neurological:  Positive for excessive daytime sleepiness. Negative for disturbances in coordination and dizziness.       Confusion and slurred speech  Psychiatric/Behavioral:         Stress  Objective  Blood pressure 118/73, pulse 70, temperature 98.7 F (37.1 C), temperature source Temporal, resp. rate 16, height _0  (1.575 m), weight 206 lb 3.2 oz (93.5 kg), SpO2 97 %. Body mass index is 37.71 kg/m.  Vitals with BMI 06/17/2021 06/09/2021 06/09/2021  Height _1  - -  Weight 206 lbs 3 oz - -  BMI 59.97 - -  Systolic [phone]  Diastolic 73 81 84  Pulse 70 81 81    Physical Exam Vitals reviewed.  Constitutional:      Appearance: She is obese.     Comments: Patient appears fatigued  Neck:     Vascular: No carotid bruit or hepatojugular reflux.  Cardiovascular:     Rate and Rhythm: Normal rate and regular rhythm.     Pulses: Normal pulses.     Heart sounds: Normal heart sounds. No murmur heard.   No gallop.  Pulmonary:     Effort: Pulmonary effort  is normal. No respiratory distress.     Breath sounds: Normal breath sounds. No wheezing, rhonchi or rales.  Musculoskeletal:     Right lower leg: No edema.     Left lower leg: No edema.  Neurological:     General: No focal deficit present.     Mental Status: She is alert and oriented to person, place, and time.     Cranial Nerves: No cranial nerve deficit.     Motor: No weakness.  Psychiatric:        Mood and Affect: Mood normal.        Behavior: Behavior normal.     Laboratory examination:   Recent Labs    06/09/21 1320 06/16/21 0757  NA 141 142  K 4.0 5.0  CL 109 99  CO2 26 24  GLUCOSE 97 92  BUN 14 17  CREATININE 0.75 0.98  CALCIUM 9.9 10.1  GFRNONAA >60  --    estimated creatinine clearance is 69.1 mL/min (by  C-G formula based on SCr of 0.98 mg/dL).  CMP Latest Ref Rng & Units 06/16/2021 06/09/2021 03/18/2008  Glucose 65 - 99 mg/dL 92 97 135(H)  BUN 6 - 24 mg/dL _0 Creatinine 0.57 - 1.00 mg/dL 0.98 0.75 0.9  Sodium 134 - 144 mmol/L 142 141 138  Potassium 3.5 - 5.2 mmol/L 5.0 4.0 4.0  Chloride 96 - 106 mmol/L 99 109 103  CO2 20 - 29 mmol/L 24 26 -  Calcium 8.7 - 10.2 mg/dL 10.1 9.9 -  Total Protein 6.5 - 8.1 g/dL - 7.4 -  Total Bilirubin 0.3 - 1.2 mg/dL - 0.7 -  Alkaline Phos 38 - 126 U/L - 68 -  AST 15 - 41 U/L - 25 -  ALT 0 - 44 U/L - 30 -   CBC Latest Ref Rng & Units 06/09/2021 03/18/2008 03/18/2008  WBC 4.0 - 10.5 K/uL 9.0 - 8.1  Hemoglobin 12.0 - 15.0 g/dL 14.6 15.6(H) 14.8  Hematocrit 36.0 - 46.0 % 43.2 46.0 42.4  Platelets 150 - 400 K/uL 304 - 339   Lipid Panel No results for input(s): CHOL, TRIG, LDLCALC, VLDL, HDL, CHOLHDL, LDLDIRECT in the last 8760 hours. Lipid Panel  No results found for: CHOL, TRIG, HDL, CHOLHDL, VLDL, LDLCALC, LDLDIRECT, LABVLDL   Recent Labs    06/09/21 1420  TSH 1.473    External labs:   Labs 03/24/2021: Total cholesterol 236, triglycerides 121, HDL 58, LDL 154. Non-HDL cholesterol 178. BUN 10, creatinine 0.84, EGFR 91/78 mL. Potassium 4.6. CMP otherwise normal. A1c 5.7%.   Labs on 03/17/2021 Lipid Panel: LDL 160, HDL 65, Triglycerides 93, Total Cholesterol 244 CMP: Sodium 142, Potassium 4.5, Glucose 107, Creatinine 0.96, ALT 73, AST 36, eGFR 66 CBC: Hemoglobin 15.4, Hematocrit 46.3, MCV 95.9, Platelets 294, CBC WNL TSH: 1.39  Medications and allergies   Allergies  Allergen Reactions   Penicillins      Medication prior to this encounter:   Outpatient Medications Prior to Visit  Medication Sig Dispense Refill   aspirin (ASPIRIN CHILDRENS) 81 MG chewable tablet Chew 1 tablet (81 mg total) by mouth daily.     betamethasone valerate (VALISONE) 0.1 % cream Apply 1 application topically daily as needed (itching on ear).     clorazepate  (TRANXENE) 7.5 MG tablet TAKE 1 CAPSULE TWICE DAILY TUESDAY THRU FRIDAY AND IF NEEDED ON OTHER DAYS. (Patient taking differently: Take 7.5 mg by mouth See admin instructions. TAKE 1 CAPSULE  DAILY TUESDAY THRU FRIDAY AND  IF NEEDED ON OTHER DAYS.) 108 tablet 1   fluocinonide (LIDEX) 0.05 % external solution Apply 2 application topically daily as needed (for itching). 2 drops in each ear as needed for itching     IBUPROFEN PO Take 600 mg by mouth daily as needed (takes 1-3 x week. (628m tablet) pain).     Multiple Vitamins-Minerals (EMERGEN-C IMMUNE PO) Take 1 packet by mouth daily as needed (for suppliment).     Multiple Vitamins-Minerals (MULTIVITAMIN PO) Take 1 tablet by mouth daily.     omeprazole (PRILOSEC) 20 MG capsule Take 20 mg by mouth daily.     PARoxetine (PAXIL) 20 MG tablet Take 1 tablet (20 mg total) by mouth daily. (Patient taking differently: Take 20 mg by mouth See admin instructions. Takes 1 tablet daily Monday through Thursday) 90 tablet 3   primidone (MYSOLINE) 50 MG tablet TAKE 1 TABLET BY MOUTH  DAILY AND AN ADDITIONAL 1/2 TABLET DAILY AS NEEDED (Patient taking differently: Take 50 mg by mouth See admin instructions. TAKE 1 TABLET BY MOUTH  DAILY MONDAY THROUGH THURSDAY) 135 tablet 4   Propylene Glycol (SYSTANE BALANCE OP) Place 1 drop into both eyes daily as needed (dry eyes).     rosuvastatin (CRESTOR) 20 MG tablet Take 1 tablet (20 mg total) by mouth daily. 30 tablet 2   saccharomyces boulardii (FLORASTOR) 250 MG capsule Take 250 mg by mouth daily.      metoprolol tartrate (LOPRESSOR) 25 MG tablet Take 25 mg by mouth 2 (two) times daily.     olmesartan-hydrochlorothiazide (BENICAR HCT) 20-12.5 MG tablet Take 0.5 tablets by mouth daily. 14 tablet 0   nitroGLYCERIN (NITROSTAT) 0.4 MG SL tablet Place 1 tablet (0.4 mg total) under the tongue every 5 (five) minutes as needed for up to 25 days for chest pain. 25 tablet 3   No facility-administered medications prior to visit.      Medication list after today's encounter   Current Outpatient Medications  Medication Instructions   aspirin (ASPIRIN CHILDRENS) 81 mg, Oral, Daily   betamethasone valerate (VALISONE) 0.1 % cream 1 application, Topical, Daily PRN   clorazepate (TRANXENE) 7.5 MG tablet TAKE 1 CAPSULE TWICE DAILY TUESDAY THRU FRIDAY AND IF NEEDED ON OTHER DAYS.   fluocinonide (LIDEX) 0.05 % external solution 2 application, Topical, Daily PRN, 2 drops in each ear as needed for itching   IBUPROFEN PO 600 mg, Oral, Daily PRN   metoprolol tartrate (LOPRESSOR) 25 mg, Oral, 2 times daily   Multiple Vitamins-Minerals (EMERGEN-C IMMUNE PO) 1 packet, Oral, Daily PRN   Multiple Vitamins-Minerals (MULTIVITAMIN PO) 1 tablet, Oral, Daily   nitroGLYCERIN (NITROSTAT) 0.4 mg, Sublingual, Every 5 min PRN   olmesartan-hydrochlorothiazide (BENICAR HCT) 20-12.5 MG tablet 0.5 tablets, Oral, Daily   omeprazole (PRILOSEC) 20 mg, Daily   PARoxetine (PAXIL) 20 mg, Oral, Daily   primidone (MYSOLINE) 50 MG tablet TAKE 1 TABLET BY MOUTH  DAILY AND AN ADDITIONAL 1/2 TABLET DAILY AS NEEDED   Propylene Glycol (SYSTANE BALANCE OP) 1 drop, Both Eyes, Daily PRN   rosuvastatin (CRESTOR) 20 mg, Oral, Daily   saccharomyces boulardii (FLORASTOR) 250 mg, Oral, Daily    Radiology:   CT angiogram of the chest and abdomen 06/09/2021: 1. Normal contour and caliber of the thoracic and abdominal aorta. No evidence of aneurysm, dissection, or other acute aortic pathology. Mild, scattered aortic atherosclerosis. 2. Coronary artery disease. 3. Hepatic steatosis. 4. Status post cholecystectomy. 5. Descending and sigmoid diverticulosis without evidence of acute diverticulitis.  Cardiac Studies:  PCV ECHOCARDIOGRAM COMPLETE 05/13/2021 Left ventricle cavity is normal in size. Mild concentric hypertrophy of the left ventricle. Normal global wall motion. Normal LV systolic function with visual EF 50-55%. Normal diastolic filling pattern. Mild  (Grade I) mitral regurgitation. Unlike study in 2015, PFO not appreciated.    PCV MYOCARDIAL PERFUSION WO LEXISCAN 05/23/2021 Exercise nuclear stress test was performed using Bruce protocol. Patient reached 6.2 METS, and 88% of age predicted maximum heart rate. Exercise capacity was low. No chest pain reported. Heart rate and hemodynamic response were normal. Stress EKG revealed no ischemic changes. Normal myocardial perfusion. Stress LVEF calculated 48%, although visually appears normal. Low risk study.    EKG:   06/09/2021: Normal sinus rhythm at a rate of 60 bpm.  Normal axis.  No evidence of ischemia or underlying injury pattern.  EKG 04/29/2021: Normal sinus rhythm with rate of 67 bpm, normal axis.  No evidence of ischemia, normal EKG.    Assessment     ICD-10-CM   1. Precordial pain  R07.2     2. Primary hypertension  I10 Ambulatory referral to Sleep Studies    3. Coronary artery calcification  I25.10    I25.84     4. Hypercholesteremia  E78.00 LDL cholesterol, direct    Lipid Panel With LDL/HDL Ratio    5. Malaise and fatigue  R53.81 Ambulatory referral to Sleep Studies   R53.83       Medications Discontinued During This Encounter  Medication Reason   metoprolol tartrate (LOPRESSOR) 25 MG tablet Reorder   olmesartan-hydrochlorothiazide (BENICAR HCT) 20-12.5 MG tablet Reorder    Meds ordered this encounter  Medications   metoprolol tartrate (LOPRESSOR) 25 MG tablet    Sig: Take 1 tablet (25 mg total) by mouth 2 (two) times daily.    Dispense:  180 tablet    Refill:  1   olmesartan-hydrochlorothiazide (BENICAR HCT) 20-12.5 MG tablet    Sig: Take 0.5 tablets by mouth daily.    Dispense:  90 tablet    Refill:  3     Orders Placed This Encounter  Procedures   LDL cholesterol, direct   Lipid Panel With LDL/HDL Ratio   Ambulatory referral to Sleep Studies    Referral Priority:   Routine    Referral Type:   Consultation    Referral Reason:   Specialty Services  Required    Number of Visits Requested:   1    Recommendations:   LYSETTE LINDENBAUM is a 55 y.o. 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 She was seen emergently on 06/09/2021, 2 weeks ago for chest pain, shortness of breath, hypertensive urgency and brain fogginess and was sent to the emergency room.    In the ED she was found to be negative for dissection of the aorta and also cardiac markers were negative for injury with no EKG abnormalities, she was discharged home with changes made to her blood pressure medications.  She now presents for follow-up.  She continues to have marked fatigue, occasional episodes of chest discomfort but again describes this as short lasting.  She has not used any sublingual nitroglycerin.  No change in physical exam.  I discussed with her regarding chronic fatigue, obesity, uncontrolled hypertension as all his factors that could be arising from sleep apnea as well, she was previously referred to Dr. Asencion Partridge Dohmeier but she had not kept up appointment.  She is willing to do this now.  Her blood pressure is now well controlled on Benicar HCT low-dose along with low-dose beta-blocker therapy.  Continue the same.  She has been on Crestor for the last 3 months, will recheck her lipids.  Coronary CTA has been ordered and she will keep up with appointment.  I will see her back in 6 weeks for follow-up.   Adrian Prows, MD, [facility] 06/17/2021, 11:40 AM Office: [phone] Fax: [phone] Pager: [phone]

## 2021-06-22 ENCOUNTER — Telehealth (HOSPITAL_COMMUNITY): Payer: Self-pay | Admitting: *Deleted

## 2021-06-22 NOTE — Telephone Encounter (Signed)
Attempted to call patient regarding upcoming cardiac CT appointment. °Left message on voicemail with name and callback number ° °Ariele Vidrio RN Navigator Cardiac Imaging °[facility] °[phone] Office °[phone] Cell ° °

## 2021-06-23 ENCOUNTER — Telehealth (HOSPITAL_COMMUNITY): Payer: Self-pay | Admitting: Emergency Medicine

## 2021-06-23 NOTE — Telephone Encounter (Signed)
Pt returning phone call regarding upcoming cardiac imaging study; pt verbalizes understanding of appt date/time, parking situation and where to check in, pre-test NPO status and medications ordered, and verified current allergies; name and call back number provided for further questions should they arise Rockwell Alexandria RN Navigator Cardiac Imaging [facility] [phone] office [phone] cell   25mg  metoprolol  Hold hctz Denies iv issues

## 2021-06-23 NOTE — Telephone Encounter (Signed)
Attempted to call patient regarding upcoming cardiac CT appointment. °Left message on voicemail with name and callback number °Nannette Zill RN Navigator Cardiac Imaging °[facility] °[phone] Office °[phone] Cell ° °

## 2021-06-24 ENCOUNTER — Other Ambulatory Visit: Payer: Self-pay

## 2021-06-24 ENCOUNTER — Ambulatory Visit (HOSPITAL_COMMUNITY)
Admission: RE | Admit: 2021-06-24 | Discharge: 2021-06-24 | Disposition: A | Discharge status: Home / Self Care | Payer: 59 | Source: Ambulatory Visit | Attending: Student | Admitting: Student

## 2021-06-24 DIAGNOSIS — I25119 Atherosclerotic heart disease of native coronary artery with unspecified angina pectoris: Secondary | ICD-10-CM | POA: Diagnosis not present

## 2021-06-24 DIAGNOSIS — R072 Precordial pain: Secondary | ICD-10-CM | POA: Diagnosis present

## 2021-06-24 MED ORDER — NITROGLYCERIN 0.4 MG SL SUBL
0.8000 mg | SUBLINGUAL_TABLET | Freq: Once | SUBLINGUAL | Status: AC
  Administered 2021-06-24: 0.8 mg via SUBLINGUAL

## 2021-06-24 MED ORDER — NITROGLYCERIN 0.4 MG SL SUBL
SUBLINGUAL_TABLET | SUBLINGUAL | Status: AC
  Filled 2021-06-24: qty 2

## 2021-06-24 MED ORDER — IOHEXOL 350 MG/ML SOLN
100.0000 mL | Freq: Once | INTRAVENOUS | Status: AC | PRN
  Administered 2021-06-24: 100 mL via INTRAVENOUS

## 2021-06-24 NOTE — Progress Notes (Signed)
CT scan completed. Tolerated well. D/C home in wheelchair. Awake and alert. In no distress. 

## 2021-06-26 ENCOUNTER — Other Ambulatory Visit: Payer: Self-pay | Admitting: Cardiology

## 2021-06-26 DIAGNOSIS — R931 Abnormal findings on diagnostic imaging of heart and coronary circulation: Secondary | ICD-10-CM

## 2021-06-26 DIAGNOSIS — R072 Precordial pain: Secondary | ICD-10-CM | POA: Insufficient documentation

## 2021-06-27 ENCOUNTER — Ambulatory Visit (HOSPITAL_COMMUNITY)
Admission: RE | Admit: 2021-06-27 | Discharge: 2021-06-27 | Disposition: A | Discharge status: Home / Self Care | Payer: 59 | Source: Ambulatory Visit | Attending: Cardiology | Admitting: Cardiology

## 2021-06-27 DIAGNOSIS — R931 Abnormal findings on diagnostic imaging of heart and coronary circulation: Secondary | ICD-10-CM | POA: Insufficient documentation

## 2021-06-27 DIAGNOSIS — R072 Precordial pain: Secondary | ICD-10-CM | POA: Insufficient documentation

## 2021-06-28 ENCOUNTER — Other Ambulatory Visit: Payer: Self-pay | Admitting: Internal Medicine

## 2021-06-28 DIAGNOSIS — Z1231 Encounter for screening mammogram for malignant neoplasm of breast: Secondary | ICD-10-CM

## 2021-07-01 ENCOUNTER — Other Ambulatory Visit: Payer: Self-pay

## 2021-07-01 ENCOUNTER — Telehealth: Payer: Self-pay | Admitting: Cardiology

## 2021-07-01 MED ORDER — OLMESARTAN MEDOXOMIL-HCTZ 20-12.5 MG PO TABS: 0.5000 | ORAL_TABLET | Freq: Every day | ORAL | 3 refills | Status: DC

## 2021-07-01 NOTE — Telephone Encounter (Signed)
Patient requesting olmesartan 20-12.5 mg refill

## 2021-07-01 NOTE — Telephone Encounter (Signed)
Refill sent.

## 2021-07-04 ENCOUNTER — Other Ambulatory Visit: Payer: Self-pay | Admitting: Cardiology

## 2021-07-04 NOTE — Progress Notes (Signed)
Coronary CTA FFR 06/24/2021: Coronary artery calcification score: Left main: 0 Left anterior descending artery: 118 Left circumflex artery: 29.3 Right coronary artery: 0.305 Total coronary calcium score of 148, places the patient at the 97th percentile for age and sex matched control.  Left main: LAD: Moderate calcified plaque 50 to 69% within the proximal/ostial LAD.  2 diagonal branches. LCx: Mild 25 to 49% calcific plaque at the ostium of OM1. RCA: Dominant.  Minimal stenosis of 0 to 24% due to mixed plaque within proximal RCA.  1. Left Main: FFR = 0.98 2. LAD: Proximal FFR = 0.96, mid FFR = 0.94, distal FFR = 0.91 3. LCX: Proximal FFR = 0.98, distal FFR = 0.97 4. RCA: Proximal FFR = 0.99, mid FFR =0.96, distal FFR = 0.96 IMPRESSION: 1.  CT FFR analysis showed no significant stenosis.

## 2021-07-04 NOTE — Progress Notes (Signed)
Seeing you 10/21

## 2021-07-04 NOTE — Progress Notes (Signed)
Seeing you 10/21

## 2021-07-14 LAB — LIPID PANEL WITH LDL/HDL RATIO
Cholesterol, Total: 167 mg/dL (ref 100–199)
HDL: 59 mg/dL (ref >39)
LDL Chol Calc (NIH): 89 mg/dL (ref 0–99)
LDL/HDL Ratio: 1.5 ratio (ref 0.0–3.2)
Triglycerides: 108 mg/dL (ref 0–149)
VLDL Cholesterol Cal: 19 mg/dL (ref 5–40)

## 2021-07-14 LAB — LDL CHOLESTEROL, DIRECT: LDL Direct: 81 mg/dL (ref 0–99)

## 2021-07-19 ENCOUNTER — Other Ambulatory Visit: Payer: Self-pay

## 2021-07-19 ENCOUNTER — Ambulatory Visit
Admission: RE | Admit: 2021-07-19 | Discharge: 2021-07-19 | Disposition: A | Discharge status: Home / Self Care | Payer: 59 | Source: Ambulatory Visit | Attending: Internal Medicine | Admitting: Internal Medicine

## 2021-07-19 DIAGNOSIS — Z1231 Encounter for screening mammogram for malignant neoplasm of breast: Secondary | ICD-10-CM

## 2021-07-22 ENCOUNTER — Other Ambulatory Visit: Payer: Self-pay

## 2021-07-22 ENCOUNTER — Encounter: Payer: Self-pay | Admitting: Cardiology

## 2021-07-22 ENCOUNTER — Ambulatory Visit: Payer: 59 | Admitting: Cardiology

## 2021-07-22 VITALS — BP 120/76 | HR 77 | Temp 97.2°F | Resp 16 | Ht 62.0 in | Wt 206.6 lb

## 2021-07-22 DIAGNOSIS — E7800 Pure hypercholesterolemia, unspecified: Secondary | ICD-10-CM

## 2021-07-22 DIAGNOSIS — I10 Essential (primary) hypertension: Secondary | ICD-10-CM

## 2021-07-22 DIAGNOSIS — R931 Abnormal findings on diagnostic imaging of heart and coronary circulation: Secondary | ICD-10-CM

## 2021-07-22 MED ORDER — ROSUVASTATIN CALCIUM 20 MG PO TABS: 20.0000 mg | ORAL_TABLET | Freq: Every day | ORAL | 3 refills | Status: DC

## 2021-07-22 MED ORDER — EZETIMIBE 10 MG PO TABS: 10.0000 mg | ORAL_TABLET | Freq: Every day | ORAL | 3 refills | Status: DC

## 2021-07-22 MED ORDER — METOPROLOL TARTRATE 50 MG PO TABS: 50.0000 mg | ORAL_TABLET | Freq: Two times a day (BID) | ORAL | 3 refills | Status: DC

## 2021-07-22 MED ORDER — METOPROLOL TARTRATE 50 MG PO TABS: 50.0000 mg | ORAL_TABLET | Freq: Every day | ORAL | 3 refills | Status: DC

## 2021-07-22 NOTE — Addendum Note (Signed)
Addended by: Delrae Rend on: 07/22/2021 08:31 PM   Modules accepted: Orders

## 2021-07-22 NOTE — Progress Notes (Addendum)
Primary Physician/Referring:  Deland Pretty, MD  Patient ID: Jamie Trujillo, female    DOB: 12-14-1965, 55 y.o.   MRN: [id number]  Chief Complaint  Patient presents with   Chest Pain   Shortness of Breath   Hyperlipidemia   Follow-up    6 weeks   HPI:    Jamie Trujillo  is a 55 y.o. 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   This is a 6-week office visit, states that she has made lifestyle changes, and also is not stressing too much about her workplace.  She has had occasional sharp pains in the chest lasting few seconds but no heaviness or tightness.  Denies any dyspnea.     Past Medical History:  Diagnosis Date   Anxiety    Depression    Essential tremor    Heart murmur 10/03/2007   Hyperlipidemia    Hypertension    Past Surgical History:  Procedure Laterality Date   [place] SURGERY  10/02/2001   Removed    Family History  Problem Relation Age of Onset   Breast cancer Mother        over 38   Heart disease Mother    Heart failure Mother    Atrial fibrillation Mother    Heart disease Father    Heart attack Brother    Heart disease Brother     Social History   Tobacco Use   Smoking status: Never   Smokeless tobacco: Never  Substance Use Topics   Alcohol use: Yes    Comment: Occ on weekends    Marital Status: Married  ROS  Review of Systems  Cardiovascular:  Negative for claudication, dyspnea on exertion, leg swelling, near-syncope and syncope.  Respiratory:  Negative for sleep disturbances due to breathing.   Musculoskeletal:  Positive for back pain.  Neurological:        Confusion and slurred speech  Psychiatric/Behavioral:         Stress  Objective  Blood pressure 120/76, pulse 77, temperature (!) 97.2 F (36.2 C), temperature source Temporal, resp. rate 16, height 5'  2" (1.575 m), weight 206 lb 9.6 oz (93.7 kg), SpO2 94 %. Body mass index is 37.79 kg/m.  Vitals with BMI 07/22/2021 06/24/2021 06/24/2021  Height _0  - -  Weight 206 lbs 10 oz - -  BMI 96.75 - -  Systolic 916 384 665  Diastolic 76 65 87  Pulse 77 - -    Physical Exam Constitutional:      Appearance: She is obese.  Neck:     Vascular: No carotid bruit or JVD.  Cardiovascular:     Rate and Rhythm: Normal rate and regular rhythm.     Pulses: Intact distal pulses.     Heart sounds: Normal heart sounds. No murmur heard.   No gallop.  Pulmonary:     Effort: Pulmonary effort is normal.     Breath sounds: Normal breath sounds.  Abdominal:     General: Bowel sounds are normal.     Palpations: Abdomen is soft.  Musculoskeletal:        General: No swelling.     Laboratory examination:   Recent Labs    06/09/21 1320 06/16/21 0757  NA 141 142  K 4.0 5.0  CL 109 99  CO2 26 24  GLUCOSE 97 92  BUN 14 17  CREATININE 0.75 0.98  CALCIUM 9.9 10.1  GFRNONAA >60  --    CrCl cannot be calculated (Patient's most recent lab result is older than the maximum 21 days allowed.).  CMP Latest Ref Rng & Units 06/16/2021 06/09/2021 03/18/2008  Glucose 65 - 99 mg/dL 92 97 135(H)  BUN 6 - 24 mg/dL _0 Creatinine 0.57 - 1.00 mg/dL 0.98 0.75 0.9  Sodium 134 - 144 mmol/L 142 141 138  Potassium 3.5 - 5.2 mmol/L 5.0 4.0 4.0  Chloride 96 - 106 mmol/L 99 109 103  CO2 20 - 29 mmol/L 24 26 -  Calcium 8.7 - 10.2 mg/dL 10.1 9.9 -  Total Protein 6.5 - 8.1 g/dL - 7.4 -  Total Bilirubin 0.3 - 1.2 mg/dL - 0.7 -  Alkaline Phos 38 - 126 U/L - 68 -  AST 15 - 41 U/L - 25 -  ALT 0 - 44 U/L - 30 -   CBC Latest Ref Rng & Units 06/09/2021 03/18/2008 03/18/2008  WBC 4.0 - 10.5 K/uL 9.0 - 8.1  Hemoglobin 12.0 - 15.0 g/dL 14.6 15.6(H) 14.8  Hematocrit 36.0 - 46.0 % 43.2 46.0 42.4  Platelets 150 - 400 K/uL 304 - 339   Lipid Panel Recent Labs    07/13/21 0804  CHOL 167  TRIG 108  LDLCALC 89  HDL 59   LDLDIRECT 81   Lipid Panel     Component Value Date/Time   CHOL 167 07/13/2021 0804   TRIG 108 07/13/2021 0804   HDL 59 07/13/2021 0804   LDLCALC 89 07/13/2021 0804   LDLDIRECT 81 07/13/2021 0804   LABVLDL 19 07/13/2021 0804     Recent Labs    06/09/21 1420  TSH 1.473    External labs:   Labs 03/24/2021: Total cholesterol 236, triglycerides 121, HDL 58, LDL 154. Non-HDL cholesterol 178. BUN 10, creatinine 0.84, EGFR 91/78 mL. Potassium 4.6. CMP otherwise normal. A1c 5.7%.   Labs on 03/17/2021 Lipid Panel: LDL 160, HDL 65, Triglycerides 93, Total Cholesterol 244 CMP: Sodium 142, Potassium 4.5, Glucose 107, Creatinine 0.96, ALT 73, AST 36, eGFR 66 CBC: Hemoglobin 15.4, Hematocrit 46.3, MCV 95.9, Platelets 294, CBC WNL TSH: 1.39  Medications and allergies   Allergies  Allergen Reactions   Penicillins      Medication prior to this encounter:   Outpatient Medications Prior to Visit  Medication Sig Dispense Refill   aspirin (ASPIRIN CHILDRENS) 81 MG chewable tablet Chew 1 tablet (81 mg total) by mouth daily.     betamethasone valerate (VALISONE) 0.1 % cream Apply 1 application topically daily as needed (itching on ear).     clorazepate (TRANXENE) 7.5 MG tablet TAKE 1 CAPSULE TWICE DAILY TUESDAY THRU FRIDAY AND IF NEEDED ON OTHER DAYS. (Patient taking differently: Take 7.5 mg by mouth See admin instructions. TAKE 1 CAPSULE  DAILY MONDAY THRU THURSDAY AND IF NEEDED ON OTHER DAYS.) 108 tablet 1   fluocinonide (LIDEX) 0.05 % external solution Apply 2 application topically daily as needed (for itching). 2 drops in each ear as needed for itching     IBUPROFEN PO Take 600 mg by mouth daily as needed (takes 1-3 x week. (613m tablet) pain).     Multiple Vitamins-Minerals (MULTIVITAMIN PO) Take 1 tablet by mouth daily.     nitroGLYCERIN (NITROSTAT) 0.4 MG SL tablet Place 1 tablet (0.4 mg total) under the tongue every 5 (five) minutes as needed for up to 25 days for chest pain. 25  tablet 3   olmesartan-hydrochlorothiazide (BENICAR HCT) 20-12.5 MG tablet TAKE 1 TABLET BY MOUTH EVERY DAY 14 tablet 0   omeprazole (PRILOSEC) 20 MG capsule Take 20 mg by mouth daily.     PARoxetine (PAXIL) 20 MG tablet Take 1 tablet (20 mg total) by mouth daily. (Patient taking differently: Take 20 mg by mouth See admin instructions. Takes 1 tablet daily Monday through Thursday) 90 tablet 3   primidone (MYSOLINE) 50 MG tablet TAKE 1 TABLET BY MOUTH  DAILY AND AN ADDITIONAL 1/2 TABLET DAILY AS NEEDED (Patient taking differently: Take 50 mg by mouth See admin instructions. TAKE 1 TABLET BY MOUTH  DAILY MONDAY THROUGH THURSDAY) 135 tablet 4   Propylene Glycol (SYSTANE BALANCE OP) Place 1 drop into both eyes daily as needed (dry eyes).     saccharomyces boulardii (FLORASTOR) 250 MG capsule Take 250 mg by mouth daily.      metoprolol tartrate (LOPRESSOR) 50 MG tablet Take 50 mg by mouth daily at 6 (six) AM.     rosuvastatin (CRESTOR) 20 MG tablet Take 1 tablet (20 mg total) by mouth daily. 30 tablet 2   metoprolol tartrate (LOPRESSOR) 25 MG tablet Take 1 tablet (25 mg total) by mouth 2 (two) times daily. 180 tablet 1   Multiple Vitamins-Minerals (EMERGEN-C IMMUNE PO) Take 1 packet by mouth daily as needed (for suppliment).     No facility-administered medications prior to visit.     Medication list after today's encounter   Current Outpatient Medications  Medication Instructions   aspirin (ASPIRIN CHILDRENS) 81 mg, Oral, Daily   betamethasone valerate (VALISONE) 0.1 % cream 1 application, Topical, Daily PRN   clorazepate (TRANXENE) 7.5 MG tablet TAKE 1 CAPSULE TWICE DAILY TUESDAY THRU FRIDAY AND IF NEEDED ON OTHER DAYS.   ezetimibe (ZETIA) 10 mg, Oral, Daily after supper   fluocinonide (LIDEX) 0.05 % external solution 2 application, Topical, Daily PRN, 2 drops in each ear as needed for itching   IBUPROFEN PO 600 mg, Oral, Daily PRN   metoprolol tartrate (LOPRESSOR) 50 mg, Oral, 2 times daily    Multiple Vitamins-Minerals (MULTIVITAMIN PO) 1 tablet, Oral, Daily   nitroGLYCERIN (NITROSTAT) 0.4 mg, Sublingual, Every 5 min PRN   olmesartan-hydrochlorothiazide (BENICAR HCT) 20-12.5 MG tablet TAKE 1 TABLET BY MOUTH EVERY DAY   omeprazole (PRILOSEC) 20 mg, Daily   PARoxetine (PAXIL) 20 mg, Oral, Daily   primidone (MYSOLINE) 50 MG tablet TAKE 1 TABLET BY MOUTH  DAILY AND AN ADDITIONAL 1/2 TABLET DAILY AS NEEDED   Propylene Glycol (SYSTANE BALANCE OP) 1 drop, Both Eyes, Daily PRN   rosuvastatin (CRESTOR) 20 mg, Oral, Daily   saccharomyces boulardii (FLORASTOR) 250 mg, Oral, Daily    Radiology:   CT angiogram of the chest and abdomen 06/09/2021: 1. Normal contour and caliber of the thoracic and abdominal aorta. No evidence of aneurysm, dissection, or other acute aortic pathology. Mild, scattered aortic atherosclerosis. 2. Coronary artery disease. 3. Hepatic steatosis. 4. Status post cholecystectomy. 5. Descending and sigmoid diverticulosis without evidence of acute diverticulitis.  Cardiac Studies:   PCV ECHOCARDIOGRAM COMPLETE 05/13/2021 Left ventricle cavity is normal in size. Mild concentric hypertrophy of the left ventricle. Normal global wall motion. Normal LV systolic function with visual EF 50-55%. Normal diastolic filling pattern. Mild (Grade I) mitral regurgitation. Unlike study in 2015, PFO not appreciated.    PCV MYOCARDIAL PERFUSION WO LEXISCAN 05/23/2021 Exercise nuclear stress test was performed using Bruce protocol. Patient reached 6.2 METS, and 88% of age predicted maximum heart  rate. Exercise capacity was low. No chest pain reported. Heart rate and hemodynamic response were normal. Stress EKG revealed no ischemic changes. Normal myocardial perfusion. Stress LVEF calculated 48%, although visually appears normal. Low risk study.  Coronary CTA FFR 06/24/2021: Coronary artery calcification score: Left main: 0 Left anterior descending artery: 118 Left circumflex  artery: 29.3 Right coronary artery: 0.305 Total coronary calcium score of 148, places the patient at the 97th percentile for age and sex matched control.   Left main: Normal. LAD: Moderate calcified plaque 50 to 69% within the proximal/ostial LAD.  2 diagonal branches. LCx: Mild 25 to 49% calcific plaque at the ostium of OM1. RCA: Dominant.  Minimal stenosis of 0 to 24% due to mixed plaque within proximal RCA.   1. Left Main: FFR = 0.98 2. LAD: Proximal FFR = 0.96, mid FFR = 0.94, distal FFR = 0.91 3. LCX: Proximal FFR = 0.98, distal FFR = 0.97 4. RCA: Proximal FFR = 0.99, mid FFR =0.96, distal FFR = 0.96 IMPRESSION: 1.  CT FFR analysis showed no significant stenosis.    EKG:   06/09/2021: Normal sinus rhythm at a rate of 60 bpm.  Normal axis.  No evidence of ischemia or underlying injury pattern.  EKG 04/29/2021: Normal sinus rhythm with rate of 67 bpm, normal axis.  No evidence of ischemia, normal EKG.    Assessment     ICD-10-CM   1. Elevated coronary artery calcium score 06/24/21: 118 corresponding to 97th percentile.  R93.1 metoprolol tartrate (LOPRESSOR) 50 MG tablet    DISCONTINUED: metoprolol tartrate (LOPRESSOR) 50 MG tablet    2. Hypercholesteremia  E78.00 rosuvastatin (CRESTOR) 20 MG tablet    ezetimibe (ZETIA) 10 MG tablet    CANCELED: Lipid Panel With LDL/HDL Ratio    3. Primary hypertension  I10 metoprolol tartrate (LOPRESSOR) 50 MG tablet    DISCONTINUED: metoprolol tartrate (LOPRESSOR) 50 MG tablet      Medications Discontinued During This Encounter  Medication Reason   metoprolol tartrate (LOPRESSOR) 25 MG tablet Error   Multiple Vitamins-Minerals (EMERGEN-C IMMUNE PO) Error   rosuvastatin (CRESTOR) 20 MG tablet Reorder   metoprolol tartrate (LOPRESSOR) 50 MG tablet Reorder   metoprolol tartrate (LOPRESSOR) 50 MG tablet      Meds ordered this encounter  Medications   DISCONTD: metoprolol tartrate (LOPRESSOR) 50 MG tablet    Sig: Take 1 tablet (50 mg  total) by mouth daily at 6 (six) AM.    Dispense:  180 tablet    Refill:  3   rosuvastatin (CRESTOR) 20 MG tablet    Sig: Take 1 tablet (20 mg total) by mouth daily.    Dispense:  90 tablet    Refill:  3   ezetimibe (ZETIA) 10 MG tablet    Sig: Take 1 tablet (10 mg total) by mouth daily after supper.    Dispense:  90 tablet    Refill:  3   metoprolol tartrate (LOPRESSOR) 50 MG tablet    Sig: Take 1 tablet (50 mg total) by mouth 2 (two) times daily.    Dispense:  180 tablet    Refill:  3    No orders of the defined types were placed in this encounter.   Recommendations:   Jamie Trujillo is a 55 y.o. 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  I reviewed the results of the recently performed coronary  CTA and also discussed regarding coronary calcium score being in the highest percentile.  She is now tolerating statin with Crestor 20 mg daily, LDL is improved significantly.  We will add Zetia to get LDL goal <70, in view of young age, preferably closer to 53.  Blood pressure is now well controlled with addition of metoprolol.  Continue the same.  I have again discussed with her regarding weight loss, she is doing her best to make changes to her lifestyle and also trying to exercise.  I will see her back in 6 months for follow-up.  She has a follow-up appointment with Dr. Deland Pretty and will have her labs done there along with follow-up of advanced fatty liver noted by imaging.    Adrian Prows, MD, [facility] 07/22/2021, 8:31 PM Office: [phone] Fax: [phone] Pager: [phone]

## 2021-07-29 ENCOUNTER — Other Ambulatory Visit: Payer: Self-pay

## 2021-07-29 ENCOUNTER — Telehealth: Payer: Self-pay | Admitting: Cardiology

## 2021-07-29 DIAGNOSIS — I10 Essential (primary) hypertension: Secondary | ICD-10-CM

## 2021-07-29 DIAGNOSIS — R931 Abnormal findings on diagnostic imaging of heart and coronary circulation: Secondary | ICD-10-CM

## 2021-07-29 MED ORDER — OLMESARTAN MEDOXOMIL-HCTZ 20-12.5 MG PO TABS: 0.5000 | ORAL_TABLET | ORAL | 3 refills | Status: DC

## 2021-07-29 MED ORDER — METOPROLOL TARTRATE 50 MG PO TABS: 50.0000 mg | ORAL_TABLET | Freq: Two times a day (BID) | ORAL | 3 refills | Status: DC

## 2021-07-29 NOTE — Telephone Encounter (Signed)
Called in Rx to pt's pharmacy. When reviewed with pt, pt concerned about her home BP readings recently. Pt having complains of lightheadedness, dizziness, and feeling weak and fatigue at times. Home BP at times have been soft with SBP ranging from 88-110 over the past week. Pt reports that he BP have been elevated secondary to anxiety and stress related to work related causes. Pt inquiring about if she should continue with Olmesartan-HCTZ 1/2 tab Qday along with metoprolol tartrate 50 mg BID or just metoprolol tartrate 50 mg BID. Will review with Dr. Jacinto Halim.

## 2021-07-29 NOTE — Telephone Encounter (Signed)
Correct refill has been sent.

## 2021-07-29 NOTE — Telephone Encounter (Signed)
Pt req 90 day ref for below meds  metoprolol tartrate (LOPRESSOR) 50 MG tablet  [id number] [place], [place] - [address]  [phone]

## 2021-07-29 NOTE — Telephone Encounter (Signed)
Patient has been taking olmesartan HCT 20/12.5 mg 1/2 tablet daily, since making lifestyle changes she has noticed low blood pressure especially on the weekends when she is not stressed.  Advised her to hold the medication on the weekends.    ICD-10-CM   1. Primary hypertension  I10 olmesartan-hydrochlorothiazide (BENICAR HCT) 20-12.5 MG tablet      Meds ordered this encounter  Medications   olmesartan-hydrochlorothiazide (BENICAR HCT) 20-12.5 MG tablet    Sig: Take 0.5 tablets by mouth every morning. Hold on weekends    Dispense:  90 tablet    Refill:  3    PT IS OUT OF REFILLS AND HAS BEEN OUT FOR OVER A WEEK.  PT HAS CALLED THE OFFICE AND WAS TOLD IT WOULD BE CALLED IN ON FRIDAY BUT WE NEVER GOT IT.  PLEASE CALL OR FAX OVER A NEW SCRIPT FOR THIS MEDICATION.     Yates Decamp, MD, [facility] 07/29/2021, 4:19 PM Office: [phone] Fax: [phone] Pager: [phone]

## 2021-09-21 NOTE — Progress Notes (Signed)
Labs 09/13/2021:  A1c 5.8%.  Total cholesterol 152, triglycerides 95, HDL 63, LDL 70.  Non-HDL cholesterol 89.  Hb 15.0/HCT 44.8, platelets 251, normal indicis.  BUN 15, creatinine 0.90, potassium 4.4, EGFR 83/72 mL, CMP otherwise normal.

## 2021-11-15 ENCOUNTER — Ambulatory Visit: Payer: 59 | Admitting: Neurology

## 2021-11-15 ENCOUNTER — Encounter: Payer: Self-pay | Admitting: Neurology

## 2021-11-15 VITALS — BP 142/88 | HR 62 | Ht 62.5 in | Wt 197.0 lb

## 2021-11-15 DIAGNOSIS — R202 Paresthesia of skin: Secondary | ICD-10-CM

## 2021-11-15 DIAGNOSIS — M542 Cervicalgia: Secondary | ICD-10-CM

## 2021-11-15 DIAGNOSIS — G8929 Other chronic pain: Secondary | ICD-10-CM | POA: Insufficient documentation

## 2021-11-15 NOTE — Progress Notes (Signed)
Chief Complaint  Patient presents with   New Patient (Initial Visit)    RM 12, Alone  NX Jamie Trujillo/Jamie Trujillo 2022/Paper/Murphy Jamie Hawthorne MD [phone]/NCV/EMG of multiple limbs C/o numbness and tingling  in neck, shoulder and arms,  Some numbness in right thigh       ASSESSMENT AND PLAN  Jamie Trujillo is a 56 y.o. female   Right hand paresthesia, left toe numbness Worsening neck pain, radiating pain to right shoulder  Hyperreflexia on examination,  Differentiation diagnosis include focal neuropathy versus right cervical radiculopathy  MRI of cervical spine  EMG nerve conduction study   DIAGNOSTIC DATA (LABS, IMAGING, TESTING) - I reviewed patient records, labs, notes, testing and imaging myself where available.  Evaluations in October 2022, LDL 81, normal CBC, CMP troponin was negative   MEDICAL HISTORY:  Jamie Trujillo is a 56 year old female, seen in request by Dr. Aviva Signs, for evaluation of numbness worsening neck pain, her primary care physician is Dr. Merri Brunette, initial evaluation was on November 15, 2021  I reviewed and summarized the referring note. PMHX. HTN Essential Tremor, primidone 50mg  daily HLD CAD,  She is a , spending a lot of time twisting her neck and body towards the right side, working outpatient  Over the years, she developed worsening neck pain, difficulty turning towards the left side, since 2022 also noticed increased right neck pain, radiating pain to right shoulder, in addition, she has developed persistent right fourth and fifth finger numbness for couple years, recently noticed right thumb numbness, there was mild clumsiness, but there was no difficulty performing her job.  In 2023, she also noticed left toe numbness, she also complains of pain from her back radiating to her spine, muscle tightness,  She denies gait abnormality, denies incontinence  X-ray of cervical spine showed cervical  degenerative changes,  She also presented to emergency room September 2022 for significant headache, dizziness, chest pain  Was found to have significantly elevated blood pressure 182/113, now on polypharmacy treatment for her blood pressure, also hyperlipidemia,  She was also seen by cardiologist Dr. October 2022, evidence of coronary artery disease on aspirin 81 mg daily  Since then she was started on Crestor 20 mg daily, complains of diffuse body achiness, lack of stamina,  PHYSICAL EXAM:   Vitals:   11/15/21 1605  BP: (!) 142/88  Pulse: 62  Weight: 197 lb (89.4 kg)  Height: 5' 2.5" (1.588 m)   Not recorded     Body mass index is 35.46 kg/m.  PHYSICAL EXAMNIATION:  Gen: NAD, conversant, well nourised, well groomed                     Cardiovascular: Regular rate rhythm, no peripheral edema, warm, nontender. Eyes: Conjunctivae clear without exudates or hemorrhage Neck: Supple, no carotid bruits. Pulmonary: Clear to auscultation bilaterally   NEUROLOGICAL EXAM:  MENTAL STATUS: Speech:    Speech is normal; fluent and spontaneous with normal comprehension.  Cognition:     Orientation to time, place and person     Normal recent and remote memory     Normal Attention span and concentration     Normal Language, naming, repeating,spontaneous speech     Fund of knowledge   CRANIAL NERVES: CN II: Visual fields are full to confrontation. Pupils are round equal and briskly reactive to light. CN III, IV, VI: extraocular movement are normal. No ptosis. CN V: Facial sensation is intact to light touch CN VII: Face  is symmetric with normal eye closure  CN VIII: Hearing is normal to causal conversation. CN IX, X: Phonation is normal. CN XI: Head turning and shoulder shrug are intact  MOTOR: There is no pronator drift of out-stretched arms. Muscle bulk and tone are normal. Muscle strength is normal.  REFLEXES: Reflexes are 2+ and symmetric at the biceps, triceps, 3/3 knees, and  ankles. Plantar responses are flexor.  SENSORY: Intact to light touch, pinprick and vibratory sensation are intact in fingers and toes.  COORDINATION: There is no trunk or limb dysmetria noted.  GAIT/STANCE: Posture is normal. Gait is steady with normal steps, base, arm swing, and turning. Heel and toe walking are normal. Tandem gait is normal.  Romberg is absent.  REVIEW OF SYSTEMS:  Full 14 system review of systems performed and notable only for as above All other review of systems were negative.   ALLERGIES: Allergies  Allergen Reactions   Penicillins     HOME MEDICATIONS: Current Outpatient Medications  Medication Sig Dispense Refill   aspirin (ASPIRIN CHILDRENS) 81 MG chewable tablet Chew 1 tablet (81 mg total) by mouth daily.     betamethasone valerate (VALISONE) 0.1 % cream Apply 1 application topically daily as needed (itching on ear).     clorazepate (TRANXENE) 7.5 MG tablet TAKE 1 CAPSULE TWICE DAILY TUESDAY THRU FRIDAY AND IF NEEDED ON OTHER DAYS. (Patient taking differently: Take 7.5 mg by mouth See admin instructions. TAKE 1 CAPSULE  DAILY MONDAY THRU THURSDAY AND IF NEEDED ON OTHER DAYS.) 108 tablet 1   ezetimibe (ZETIA) 10 MG tablet Take 1 tablet (10 mg total) by mouth daily after supper. 90 tablet 3   fluocinonide (LIDEX) 0.05 % external solution Apply 2 application topically daily as needed (for itching). 2 drops in each ear as needed for itching     IBUPROFEN PO Take 600 mg by mouth daily as needed (takes 1-3 x week. (600mg  tablet) pain).     metoprolol tartrate (LOPRESSOR) 50 MG tablet Take 1 tablet (50 mg total) by mouth 2 (two) times daily. 180 tablet 3   Multiple Vitamins-Minerals (MULTIVITAMIN PO) Take 1 tablet by mouth daily.     olmesartan-hydrochlorothiazide (BENICAR HCT) 20-12.5 MG tablet Take 0.5 tablets by mouth every morning. Hold on weekends 90 tablet 3   omeprazole (PRILOSEC) 20 MG capsule Take 20 mg by mouth daily.     PARoxetine (PAXIL) 20 MG  tablet Take 1 tablet (20 mg total) by mouth daily. (Patient taking differently: Take 20 mg by mouth See admin instructions. Takes 1 tablet daily Monday through Thursday) 90 tablet 3   primidone (MYSOLINE) 50 MG tablet TAKE 1 TABLET BY MOUTH  DAILY AND AN ADDITIONAL 1/2 TABLET DAILY AS NEEDED (Patient taking differently: Take 50 mg by mouth See admin instructions. TAKE 1 TABLET BY MOUTH  DAILY MONDAY THROUGH THURSDAY) 135 tablet 4   Propylene Glycol (SYSTANE BALANCE OP) Place 1 drop into both eyes daily as needed (dry eyes).     rosuvastatin (CRESTOR) 20 MG tablet Take 1 tablet (20 mg total) by mouth daily. 90 tablet 3   saccharomyces boulardii (FLORASTOR) 250 MG capsule Take 250 mg by mouth daily.      nitroGLYCERIN (NITROSTAT) 0.4 MG SL tablet Place 1 tablet (0.4 mg total) under the tongue every 5 (five) minutes as needed for up to 25 days for chest pain. 25 tablet 3   No current facility-administered medications for this visit.    PAST MEDICAL HISTORY: Past Medical History:  Diagnosis Date   Anxiety    Depression    Essential tremor    Heart murmur 10/03/2007   Hyperlipidemia    Hypertension     PAST SURGICAL HISTORY: Past Surgical History:  Procedure Laterality Date   FINGER SURGERY  1990   GALLBLADDER SURGERY  10/02/2001   Removed     FAMILY HISTORY: Family History  Problem Relation Age of Onset   Breast cancer Mother        over 76   Heart disease Mother    Heart failure Mother    Atrial fibrillation Mother    Heart disease Father    Heart attack Brother    Heart disease Brother     SOCIAL HISTORY: Social History   Socioeconomic History   Marital status: Married    Spouse name: Roe Coombs    Number of children: 0   Years of education: 12+   Highest education level: Not on file  Occupational History   Occupation: Oral Surgon  Tobacco Use   Smoking status: Never   Smokeless tobacco: Never  Vaping Use   Vaping Use: Never used  Substance and Sexual Activity    Alcohol use: Yes    Comment: Occ on weekends    Drug use: No   Sexual activity: Not on file  Other Topics Concern   Not on file  Social History Narrative   Patient is an Transport planner.    Patient lives at home with husband Grayling Congress.    Patient has no children.    Patient is working at a [employer] manager.    Patient is right handed.    Social Determinants of Health   Financial Resource Strain: Not on file  Food Insecurity: Not on file  Transportation Needs: Not on file  Physical Activity: Not on file  Stress: Not on file  Social Connections: Not on file  Intimate Partner Violence: Not on file      Levert Feinstein, M.D. Ph.D.  [facility] [address] [place], [place] [address] Ph: [phone] Fax: [phone]  CC:  Aviva Signs, MD [address] [place],  [place] [address]  Merri Brunette, MD

## 2021-11-17 ENCOUNTER — Telehealth: Payer: Self-pay | Admitting: Neurology

## 2021-11-17 NOTE — Telephone Encounter (Signed)
[employer] order sent to [facility], they will obtain the auth and reach out to the patient to schedule.  ?

## 2021-11-28 ENCOUNTER — Telehealth: Payer: Self-pay | Admitting: Neurology

## 2021-11-28 ENCOUNTER — Other Ambulatory Visit: Payer: Self-pay | Admitting: *Deleted

## 2021-11-28 ENCOUNTER — Encounter: Payer: Self-pay | Admitting: Neurology

## 2021-11-28 MED ORDER — ALPRAZOLAM 1 MG PO TABS: ORAL_TABLET | ORAL | 0 refills | Status: DC

## 2021-11-28 NOTE — Telephone Encounter (Signed)
Pt requesting prescription for medication to calm her during her MRI on 12/09/21. Would like to call from the nurse to confirm medication is sent to pharmacy. Can leave a voicemail.

## 2021-11-28 NOTE — Telephone Encounter (Signed)
Patient sent mychart message.

## 2021-11-28 NOTE — Telephone Encounter (Signed)
Pt also sent a mychart message. She was informed the prescription request has sent to Dr. Terrace Arabia for approval.

## 2021-12-01 ENCOUNTER — Other Ambulatory Visit: Payer: Self-pay | Admitting: Internal Medicine

## 2021-12-01 ENCOUNTER — Other Ambulatory Visit: Payer: Self-pay | Admitting: Neurology

## 2021-12-06 ENCOUNTER — Institutional Professional Consult (permissible substitution): Payer: 59 | Admitting: Neurology

## 2021-12-09 ENCOUNTER — Ambulatory Visit
Admission: RE | Admit: 2021-12-09 | Discharge: 2021-12-09 | Disposition: A | Discharge status: Home / Self Care | Payer: 59 | Source: Ambulatory Visit | Attending: Neurology | Admitting: Neurology

## 2021-12-09 ENCOUNTER — Other Ambulatory Visit: Payer: Self-pay

## 2021-12-09 DIAGNOSIS — M542 Cervicalgia: Secondary | ICD-10-CM

## 2021-12-09 DIAGNOSIS — R202 Paresthesia of skin: Secondary | ICD-10-CM

## 2021-12-12 ENCOUNTER — Ambulatory Visit: Payer: 59 | Admitting: Neurology

## 2021-12-12 ENCOUNTER — Other Ambulatory Visit: Payer: Self-pay

## 2021-12-12 ENCOUNTER — Ambulatory Visit (INDEPENDENT_AMBULATORY_CARE_PROVIDER_SITE_OTHER): Payer: 59 | Admitting: Neurology

## 2021-12-12 DIAGNOSIS — M542 Cervicalgia: Secondary | ICD-10-CM

## 2021-12-12 DIAGNOSIS — R202 Paresthesia of skin: Secondary | ICD-10-CM

## 2021-12-12 MED ORDER — CYCLOBENZAPRINE HCL 10 MG PO TABS: 10.0000 mg | ORAL_TABLET | Freq: Every day | ORAL | 3 refills | Status: DC | PRN

## 2021-12-12 NOTE — Progress Notes (Signed)
No chief complaint on file.     ASSESSMENT AND PLAN  Jamie Trujillo is a 56 y.o. female   Right hand paresthesia, left toe numbness Worsening neck pain, radiating pain to right shoulder  EMG nerve conduction study today is essentially normal, in specific, no evidence of large fiber peripheral neuropathy, right carpal tunnel syndromes, no evidence of right ulnar neuropathy, or right cervical radiculopathy  MRI of the cervical spine showed multilevel mild degenerative changes, no evidence of significant canal or foraminal stenosis  Proceed with B12, inflammatory markers to rule out systemic inflammatory process, Chronic low back pain  Brisk reflex of bilateral lower extremity, most suggestive of musculoskeletal etiology  Emphasized importance of neck stretching exercise  Flexeril 10 mg as needed   DIAGNOSTIC DATA (LABS, IMAGING, TESTING) - I reviewed patient records, labs, notes, testing and imaging myself where available.  Evaluations in October 2022, LDL 81, normal CBC, CMP troponin was negative   MEDICAL HISTORY:  Jamie KarvonenCarol E Sylla is a 56 year old female, seen in request by Dr. Aviva SignsWilloughby, Julian, for evaluation of numbness worsening neck pain, her primary care physician is Dr. Merri BrunettePharr, Walter, initial evaluation was on November 15, 2021  I reviewed and summarized the referring note. PMHX. 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  In 2023, she also noticed  left toe numbness, she also complains of pain from her back radiating to her spine, muscle tightness,  She denies gait abnormality, denies incontinence  X-ray of cervical spine showed cervical degenerative changes,  She also presented to emergency room September 2022 for significant headache, dizziness, chest pain  Was found to have significantly elevated blood pressure 182/113, now on polypharmacy treatment for her blood pressure, also hyperlipidemia,  She was also seen by cardiologist Dr. Nadara EatonGangi, evidence of coronary artery disease on aspirin 81 mg daily  Since then she was started on Crestor 20 mg daily, complains of diffuse body achiness, lack of stamina,  Update December 12, 2021: Electrodiagnostic study today showed no significant abnormality. She continued complaints of significant low back pain, difficult change of position, causing mild gait difficulty  In addition, she is most concerned about her right thumb paresthesia, right elbow pain, especially when she tried to pop up her body using right elbow  Personally reviewed MRI of cervical spine, mild degenerative changes, no significant canal or foraminal narrowing  Reviewed laboratory evaluations A1c 5.8, normal LDL, TSH, CMP, CBC,   PHYSICAL EXAM:   There were no vitals filed for this visit.  PHYSICAL EXAMNIATION:  Gen: NAD, conversant, well nourised, well groomed                     Cardiovascular: Regular rate rhythm, no peripheral edema, warm, nontender. Eyes: Conjunctivae clear without exudates or hemorrhage Neck: Supple, no carotid bruits. Pulmonary: Clear to auscultation bilaterally   NEUROLOGICAL EXAM:  MENTAL STATUS: Speech/cognition: Awake, alert, oriented to history taking and casual conversation   CRANIAL NERVES: CN II: Visual fields are full to confrontation. Pupils are  round equal and briskly reactive to light. CN III, IV, VI: extraocular movement are normal. No ptosis. CN V: Facial sensation is intact  to light touch CN VII: Face is symmetric with normal eye closure  CN VIII: Hearing is normal to causal conversation. CN IX, X: Phonation is normal. CN XI: Head turning and shoulder shrug are intact  MOTOR: There is no pronator drift of out-stretched arms. Muscle bulk and tone are normal. Muscle strength is normal.  REFLEXES: Reflexes are 2+ and symmetric at the biceps, triceps, 3/3 knees, and ankles. Plantar responses are flexor.  SENSORY: Intact to light touch, pinprick and vibratory sensation are intact in fingers and toes.  COORDINATION: There is no trunk or limb dysmetria noted.  GAIT/STANCE: Posture is normal. Gait is steady   REVIEW OF SYSTEMS:  Full 14 system review of systems performed and notable only for as above All other review of systems were negative.   ALLERGIES: Allergies  Allergen Reactions   Penicillins     HOME MEDICATIONS: Current Outpatient Medications  Medication Sig Dispense Refill   ALPRAZolam (XANAX) 1 MG tablet Take 1-2 tablets thirty minutes prior to MRI.  May take one additional tablet before entering scanner, if needed.  MUST HAVE DRIVER. 3 tablet 0   aspirin (ASPIRIN CHILDRENS) 81 MG chewable tablet Chew 1 tablet (81 mg total) by mouth daily.     betamethasone valerate (VALISONE) 0.1 % cream Apply 1 application topically daily as needed (itching on ear).     clorazepate (TRANXENE) 7.5 MG tablet TAKE 1 CAPSULE TWICE DAILY TUESDAY THRU FRIDAY AND IF NEEDED ON OTHER DAYS. (Patient taking differently: Take 7.5 mg by mouth See admin instructions. TAKE 1 CAPSULE  DAILY MONDAY THRU THURSDAY AND IF NEEDED ON OTHER DAYS.) 108 tablet 1   ezetimibe (ZETIA) 10 MG tablet Take 1 tablet (10 mg total) by mouth daily after supper. 90 tablet 3   fluocinonide (LIDEX) 0.05 % external solution Apply 2 application topically daily as needed (for itching). 2 drops in each ear as needed for itching     IBUPROFEN PO Take 600 mg by mouth daily as needed (takes 1-3 x week.  (600mg  tablet) pain).     metoprolol tartrate (LOPRESSOR) 50 MG tablet Take 1 tablet (50 mg total) by mouth 2 (two) times daily. 180 tablet 3   Multiple Vitamins-Minerals (MULTIVITAMIN PO) Take 1 tablet by mouth daily.     nitroGLYCERIN (NITROSTAT) 0.4 MG SL tablet Place 1 tablet (0.4 mg total) under the tongue every 5 (five) minutes as needed for up to 25 days for chest pain. 25 tablet 3   olmesartan-hydrochlorothiazide (BENICAR HCT) 20-12.5 MG tablet Take 0.5 tablets by mouth every morning. Hold on weekends 90 tablet 3   omeprazole (PRILOSEC) 20 MG capsule Take 20 mg by mouth daily.     PARoxetine (PAXIL) 20 MG tablet Take 1 tablet (20 mg total) by mouth daily. (Patient taking differently: Take 20 mg by mouth See admin instructions. Takes 1 tablet daily Monday through Thursday) 90 tablet 3   primidone (MYSOLINE) 50 MG tablet TAKE 1 TABLET BY MOUTH  DAILY AND AN ADDITIONAL 1/2 TABLET DAILY AS NEEDED (Patient taking differently: Take 50 mg by mouth See admin instructions. TAKE 1 TABLET BY MOUTH  DAILY MONDAY THROUGH THURSDAY) 135 tablet 4   Propylene Glycol (SYSTANE BALANCE OP) Place 1 drop into both eyes daily as needed (dry eyes).     rosuvastatin (CRESTOR) 20 MG tablet Take 1 tablet (20 mg total) by  mouth daily. 90 tablet 3   saccharomyces boulardii (FLORASTOR) 250 MG capsule Take 250 mg by mouth daily.      No current facility-administered medications for this visit.    PAST MEDICAL HISTORY: Past Medical History:  Diagnosis Date   Anxiety    Depression    Essential tremor    Heart murmur 10/03/2007   Hyperlipidemia    Hypertension     PAST SURGICAL HISTORY: Past Surgical History:  Procedure Laterality Date   FINGER SURGERY  1990   GALLBLADDER SURGERY  10/02/2001   Removed     FAMILY HISTORY: Family History  Problem Relation Age of Onset   Breast cancer Mother        over 95   Heart disease Mother    Heart failure Mother    Atrial fibrillation Mother    Heart disease  Father    Heart attack Brother    Heart disease Brother     SOCIAL HISTORY: Social History   Socioeconomic History   Marital status: Married    Spouse name: Roe Coombs    Number of children: 0   Years of education: 12+   Highest education level: Not on file  Occupational History   Occupation: Oral Surgon  Tobacco Use   Smoking status: Never   Smokeless tobacco: Never  Vaping Use   Vaping Use: Never used  Substance and Sexual Activity   Alcohol use: Yes    Comment: Occ on weekends    Drug use: No   Sexual activity: Not on file  Other Topics Concern   Not on file  Social History Narrative   Patient is an Transport planner.    Patient lives at home with husband Grayling Congress.    Patient has no children.    Patient is working at a Theme park manager.    Patient is right handed.    Social Determinants of Health   Financial Resource Strain: Not on file  Food Insecurity: Not on file  Transportation Needs: Not on file  Physical Activity: Not on file  Stress: Not on file  Social Connections: Not on file  Intimate Partner Violence: Not on file      Levert Feinstein, M.D. Ph.D.  [facility] [address] [place], [place] [address] Ph: [phone] Fax: [phone]  CC:  Merri Brunette, MD [address] [place],  [place] [address]  Merri Brunette, MD

## 2021-12-12 NOTE — Procedures (Signed)
? ? ? ?   ?Full Name: Kolbi Tofte Gender: Female ?MRN #: [id number] Date of Birth: 03-Oct-1965 ?   ?Visit Date: 12/12/2021 15:29 ?Age: 56 Years ?Examining Physician: Levert Feinstein, MD  ?Referring Physician: Levert Feinstein, MD ?Height: 5 feet 2 inch ?Patient History: 56 year old female, presented with intermittent right hand left toe paresthesia, chronic low back pain, neck pain. ? ?Summary of the test: ?Nerve conduction study: ?Bilateral sural, superficial peroneal sensory responses were normal.  Right median, ulnar sensory responses were normal.  Right median mixed response was normal. ? ?Bilateral tibial, peroneal to EDB, right ulnar and median motor responses were normal. ? ?Electromyography: ? ?Selected needle examination of bilateral lower extremity muscles, bilateral lumbosacral paraspinal muscles, right upper extremity muscles and right cervical paraspinal muscles showed no significant abnormality. ? ?   ?Conclusion: ?This is a normal study.  There is no electrodiagnostic evidence of large fiber peripheral neuropathy, bilateral lumbosacral radiculopathy or right cervical radiculopathy. ? ? ? ?------------------------------- ?Levert Feinstein M.D. PhD ? ?[facility] ?[address], [address] ?[place], [place] [address] ?Tel: [phone] ?Fax: [phone] ? ?Verbal informed consent was obtained from the patient, patient was informed of potential risk of procedure, including bruising, bleeding, hematoma formation, infection, muscle weakness, muscle pain, numbness, among others. ?   ? ?   ?MNC ?   ?Nerve / Sites Muscle Latency Ref. Amplitude Ref. Rel Amp Segments Distance Velocity Ref. Area  ?  ms ms mV mV %  cm m/s m/s mVms  ?R Median - APB  ?   Wrist APB 2.9 ?4.4 7.6 ?4.0 100 Wrist - APB 7   23.8  ?   Upper arm APB 6.2  6.8  90 Upper arm - Wrist 21 63 ?49 23.3  ?R Ulnar - ADM  ?   Wrist ADM 2.2 ?3.3 7.1 ?6.0 100 Wrist - ADM 7   21.3  ?   B.Elbow ADM 4.7  5.8  81.8 B.Elbow - Wrist 15 61 ?49 18.7  ?   A.Elbow ADM  7.0  5.6  95.4 A.Elbow - B.Elbow 14 62 ?49 18.9  ?L Peroneal - EDB  ?   Ankle EDB 3.6 ?6.5 3.7 ?2.0 100 Ankle - EDB 9   11.7  ?   Fib head EDB 9.1  3.3  90.4 Fib head - Ankle 26 47 ?44 11.9  ?   Pop fossa EDB 11.2  3.5  104 Pop fossa - Fib head 10 49 ?44 11.3  ?       Pop fossa - Ankle      ?R Peroneal - EDB  ?   Ankle EDB 5.2 ?6.5 1.9 ?2.0 100 Ankle - EDB 9   6.5  ?   Fib head EDB 10.6  1.5  78.9 Fib head - Ankle 24 44 ?44 6.0  ?   Pop fossa EDB 13.5  1.4  93.6 Pop fossa - Fib head 13 46 ?44 5.5  ?       Pop fossa - Ankle      ?L Tibial - AH  ?   Ankle AH 2.7 ?5.8 12.0 ?4.0 100 Ankle - AH 9   39.4  ?   Pop fossa AH 10.8  8.3  69.5 Pop fossa - Ankle 39 48 ?41 31.3  ?R Tibial - AH  ?   Ankle AH 5.2 ?5.8 8.7 ?4.0 100 Ankle - AH 9   20.1  ?   Pop fossa AH 14.4  5.2  60.3 Pop fossa -  Ankle 38 41 ?41 15.4  ?               ?SNC ?   ?Nerve / Sites Rec. Site Peak Lat Ref.  Amp Ref. Segments Distance Peak Diff Ref.  ?  ms ms ?V ?V  cm ms ms  ?L Sural - Ankle (Calf)  ?   Calf Ankle 3.1 ?4.4 24 ?6 Calf - Ankle 14    ?R Sural - Ankle (Calf)  ?   Calf Ankle 3.0 ?4.4 11 ?6 Calf - Ankle 14    ?L Superficial peroneal - Ankle  ?   Lat leg Ankle 2.8 ?4.4 6 ?6 Lat leg - Ankle 14    ?R Superficial peroneal - Ankle  ?   Lat leg Ankle 3.6 ?4.4 7 ?6 Lat leg - Ankle 14    ?R Median, Ulnar - Transcarpal comparison  ?   Median Palm Wrist 1.9 ?2.2 37 ?35 Median Palm - Wrist 8    ?   Ulnar Palm Wrist 1.8 ?2.2 35 ?12 Ulnar Palm - Wrist 8    ?      Median Palm - Ulnar Palm  0.1 ?0.4  ?R Median - Orthodromic (Dig II, Mid palm)  ?   Dig II Wrist 2.7 ?3.4 35 ?10 Dig II - Wrist 13    ?R Ulnar - Orthodromic, (Dig V, Mid palm)  ?   Dig V Wrist 2.5 ?3.1 11 ?5 Dig V - Wrist 11    ?                 ?F  Wave ?   ?Nerve F Lat Ref.  ? ms ms  ?L Tibial - AH 46.9 ?56.0  ?R Tibial - AH 52.9 ?56.0  ?R Median - APB 24.6 ?31.0  ?R Ulnar - ADM 24.8 ?32.0  ?           ?EMG Summary Table   ? Spontaneous MUAP Recruitment  ?Muscle IA Fib PSW Fasc Other Amp Dur. Poly  Pattern  ?R. First dorsal interosseous Normal None None None _______ Normal Normal Normal Normal  ?R. Pronator teres Normal None None None _______ Normal Normal Normal Normal  ?R. Biceps brachii Normal None None None _______ Normal Normal Normal Normal  ?R. Deltoid Normal None None None _______ Normal Normal Normal Normal  ?R. Triceps brachii Normal None None None _______ Normal Normal Normal Normal  ?R. Extensor digitorum communis Normal None None None _______ Normal Normal Normal Normal  ?L. Tibialis anterior Normal None None None _______ Normal Normal Normal Normal  ?L. Tibialis posterior Normal None None None _______ Normal Normal Normal Normal  ?L. Peroneus longus Normal None None None _______ Normal Normal Normal Normal  ?L. Vastus lateralis Normal None None None _______ Normal Normal Normal Normal  ?R. Tibialis anterior Normal None None None _______ Normal Normal Normal Normal  ?R. Tibialis posterior Normal None None None _______ Normal Normal Normal Normal  ?R. Peroneus longus Normal None None None _______ Normal Normal Normal Normal  ?R. Gastrocnemius (Medial head) Normal None None None _______ Normal Normal Normal Normal  ?L. Gastrocnemius (Medial head) Normal None None None _______ Normal Normal Normal Normal  ?R. Vastus lateralis Normal None None None _______ Normal Normal Normal Normal  ?R. Lumbar paraspinals (low) Normal None None None _______ Normal Normal Normal Normal  ?R. Lumbar paraspinals (mid) Normal None None None _______ Normal Normal Normal Normal  ?L. Lumbar paraspinals (low) Normal None None None _______ Normal Normal  Normal Normal  ?L. Lumbar paraspinals (mid) Normal None None None _______ Normal Normal Normal Normal  ?R. Biceps femoris (short head) Normal None None None _______ Normal Normal Normal Normal  ? ?  ?

## 2021-12-15 ENCOUNTER — Other Ambulatory Visit: Payer: Self-pay | Admitting: Neurology

## 2022-01-20 ENCOUNTER — Ambulatory Visit: Payer: 59 | Admitting: Cardiology

## 2022-01-27 ENCOUNTER — Ambulatory Visit: Payer: 59 | Admitting: Cardiology

## 2022-02-01 ENCOUNTER — Encounter: Payer: 59 | Admitting: Neurology

## 2022-02-07 ENCOUNTER — Ambulatory Visit: Payer: 59 | Admitting: Neurology

## 2022-02-07 ENCOUNTER — Encounter: Payer: Self-pay | Admitting: Neurology

## 2022-02-07 VITALS — BP 116/75 | HR 91 | Ht 62.5 in | Wt 198.5 lb

## 2022-02-07 DIAGNOSIS — M542 Cervicalgia: Secondary | ICD-10-CM | POA: Diagnosis not present

## 2022-02-07 DIAGNOSIS — G250 Essential tremor: Secondary | ICD-10-CM | POA: Diagnosis not present

## 2022-02-07 DIAGNOSIS — R202 Paresthesia of skin: Secondary | ICD-10-CM

## 2022-02-07 DIAGNOSIS — G8929 Other chronic pain: Secondary | ICD-10-CM | POA: Diagnosis not present

## 2022-02-07 MED ORDER — PRIMIDONE 50 MG PO TABS
ORAL_TABLET | ORAL | 4 refills | Status: DC
Start: 2022-02-07 — End: 2023-05-14

## 2022-02-07 NOTE — Patient Instructions (Addendum)
Lets check labs today  ?For now let's continue current medications  ?We may consider adding nortriptyline to help with sleep, mood, paresthesia symptoms ?See you back in 6 months ?

## 2022-02-07 NOTE — Progress Notes (Signed)
? ? ?Patient: Jamie Trujillo ?Date of Birth: [birth year] ? ?Reason for Visit: Follow up ?History from: Patient ?Primary Neurologist: Dr. Krista Blue  ? ?ASSESSMENT AND PLAN ?56 y.o. year old female  ? ?Right hand paresthesia, left hand numbness ?Neck pain, radiating pain to right shoulder ?Chronic low back pain ?-NCV/EMG March 2023 essentially normal ?-MRI cervical spine showed multilevel mild degenerative changes, no evidence of significant canal or foraminal stenosis ?-Continue Flexeril as needed, could benefit from PT, neuromuscular therapy, massage, unfortunately is very limited on time with work stressors ?-Check labs CRP, ESR, ANA, B12, A1c ? ?4. Essential Tremor ?-Stable, current regimen works well ?-Continue Tranxene 7.5 mg tablet, once daily Monday-Thursday, if needed other days ?-Continue primidone 50 mg daily, additional half tablet daily PRN ?-For now, continue Paxil 20 mg daily, may consider addition of nortriptyline to help with mood, sleeping, neuromuscular pain ? ? ?HISTORY  ?Jamie Trujillo is a 56 year old female, seen in request by Dr. Doreatha Martin, for evaluation of numbness worsening neck pain, her primary care physician is Dr. Deland Pretty, initial evaluation was on November 15, 2021 ?  ?I reviewed and summarized the referring note. PMHX. ?HTN ?Essential Tremor, primidone 20m daily ?HLD ?CAD, ?  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 ?Over the years, she developed worsening neck pain, difficulty turning towards the left side, since 2022 also noticed increased right neck pain, radiating pain to right shoulder, in addition, she has developed persistent right fourth and fifth finger numbness for couple years, recently noticed right thumb numbness, there was mild clumsiness, but there was no difficulty performing her job. ? ?In 2023, she also noticed left toe numbness, she also complains of pain from her back  radiating to her spine, muscle tightness, ? ?She denies gait abnormality, denies incontinence ? ?X-ray of cervical spine showed cervical degenerative changes, ?  ?She also presented to emergency room September 2022 for significant headache, dizziness, chest pain ? ?Was found to have significantly elevated blood pressure 182/113, now on polypharmacy treatment for her blood pressure, also hyperlipidemia, ? ?She was also seen by cardiologist Dr. GNadyne Coombes evidence of coronary artery disease on aspirin 81 mg daily ? ?Since then she was started on Crestor 20 mg daily, complains of diffuse body achiness, lack of stamina, ?  ?Update December 12, 2021: ?Electrodiagnostic study today showed no significant abnormality. ?She continued complaints of significant low back pain, difficult change of position, causing mild gait difficulty ? ?In addition, she is most concerned about her right thumb paresthesia, right elbow pain, especially when she tried to pop up her body using right elbow ? ?Personally reviewed MRI of cervical spine, mild degenerative changes, no significant canal or foraminal narrowing ? ?Reviewed laboratory evaluations A1c 5.8, normal LDL, TSH, CMP, CBC ? ?Update Feb 07, 2022 SS: Work stress, continues slight paresthesia to right 1-2 fingers, has pain in neck to left and right side, left > right, also in left great toe. Chronic low back pain. Doesn't like taking medications, makes her sleepy. Takes Flexeril if back is bad, if absolutely necessary. Not sleeping well, a lot of stress. Tremor is stable. ? ?REVIEW OF SYSTEMS: Out of a complete 14 system review of symptoms, the patient complains only of the following symptoms, and all other reviewed systems are negative. ? ?See HPI ? ?ALLERGIES: ?Allergies  ?Allergen Reactions  ? Penicillins   ? ? ?HOME MEDICATIONS: ?Outpatient Medications Prior to  Visit  ?Medication Sig Dispense Refill  ? aspirin (ASPIRIN CHILDRENS) 81 MG chewable tablet Chew 1 tablet (81 mg total) by mouth  daily.    ? betamethasone valerate (VALISONE) 0.1 % cream Apply 1 application topically daily as needed (itching on ear).    ? clorazepate (TRANXENE) 7.5 MG tablet TAKE 1 CAPSULE  DAILY MONDAY THRU THURSDAY AND IF NEEDED ON OTHER DAYS. 90 tablet 1  ? cyclobenzaprine (FLEXERIL) 10 MG tablet Take 1 tablet (10 mg total) by mouth daily as needed for muscle spasms. 30 tablet 3  ? ezetimibe (ZETIA) 10 MG tablet Take 1 tablet (10 mg total) by mouth daily after supper. 90 tablet 3  ? fluocinonide (LIDEX) 0.05 % external solution Apply 2 application topically daily as needed (for itching). 2 drops in each ear as needed for itching    ? IBUPROFEN PO Take 600 mg by mouth daily as needed (takes 1-3 x week. (646m tablet) pain).    ? metoprolol tartrate (LOPRESSOR) 50 MG tablet Take 1 tablet (50 mg total) by mouth 2 (two) times daily. 180 tablet 3  ? Multiple Vitamins-Minerals (MULTIVITAMIN PO) Take 1 tablet by mouth daily.    ? olmesartan-hydrochlorothiazide (BENICAR HCT) 20-12.5 MG tablet Take 0.5 tablets by mouth every morning. Hold on weekends 90 tablet 3  ? omeprazole (PRILOSEC) 20 MG capsule Take 20 mg by mouth daily.    ? PARoxetine (PAXIL) 20 MG tablet Take 1 tablet (20 mg total) by mouth daily. (Patient taking differently: Take 20 mg by mouth See admin instructions. Takes 1 tablet daily Monday through Thursday) 90 tablet 3  ? Propylene Glycol (SYSTANE BALANCE OP) Place 1 drop into both eyes daily as needed (dry eyes).    ? rosuvastatin (CRESTOR) 20 MG tablet Take 1 tablet (20 mg total) by mouth daily. 90 tablet 3  ? saccharomyces boulardii (FLORASTOR) 250 MG capsule Take 250 mg by mouth daily.     ? primidone (MYSOLINE) 50 MG tablet TAKE 1 TABLET BY MOUTH  DAILY AND AN ADDITIONAL 1/2 TABLET DAILY AS NEEDED (Patient taking differently: Take 50 mg by mouth See admin instructions. TAKE 1 TABLET BY MOUTH  DAILY MONDAY THROUGH THURSDAY) 135 tablet 4  ? nitroGLYCERIN (NITROSTAT) 0.4 MG SL tablet Place 1 tablet (0.4 mg total)  under the tongue every 5 (five) minutes as needed for up to 25 days for chest pain. 25 tablet 3  ? ALPRAZolam (XANAX) 1 MG tablet Take 1-2 tablets thirty minutes prior to MRI.  May take one additional tablet before entering scanner, if needed.  MUST HAVE DRIVER. 3 tablet 0  ? ?No facility-administered medications prior to visit.  ? ? ?PAST MEDICAL HISTORY: ?Past Medical History:  ?Diagnosis Date  ? Anxiety   ? Depression   ? Essential tremor   ? Heart murmur 10/03/2007  ? Hyperlipidemia   ? Hypertension   ? ? ?PAST SURGICAL HISTORY: ?Past Surgical History:  ?[place]ality Date  [place] SURG[place]  10/02/2001  ? Removed   ? ? ?FAMILY HISTORY: ?Family History  ?Problem Relation Age of Onset  ? Breast cancer Mother   ?     over 562 ? Heart disease Mother   ? Heart failure Mother   ? Atrial fibrillation Mother   ? Heart disease Father   ? Heart attack Brother   ? Heart disease Brother   ? ? ?SOCIAL HISTORY: ?Social History  ? ?Socioeconomic History  ? Marital status: Married  ?  Spouse name: Timmothy Sours   ? Number of children: 0  ? Years of education: 12+  ? Highest education level: Not on file  ?Occupational History  ? Occupation: Oral Surgon  ?Tobacco Use  ? Smoking status: Never  ? Smokeless tobacco: Never  ?Vaping Use  ? Vaping Use: Never used  ?Substance and Sexual Activity  ? Alcohol use: Yes  ?  Comment: Occ on weekends   ? Drug use: No  ? Sexual activity: Not on file  ?Other Topics Concern  ? Not on file  ?Social History Narrative  ? Patient is an Chief Financial Officer.   ? Patient lives at home with husband Elvia Collum.   ? Patient has no children.   ? Patient is working at a [employer]il [employer].   ? Patient is right handed.   ? ?Social Determinants of Health  ? ?Financial Resource Strain: Not on file  ?Food Insecurity: Not on file  ?Transportation Needs: Not on file  ?Physical Activity: Not on file  ?Stress: Not on file  ?Social Connections: Not on file  ?Intimate Partner Violence: Not on file   ? ?PHYSICAL EXAM ? ?Vitals:  ? 02/07/22 0730  ?BP: 116/75  ?Pulse: 91  ?Weight: 198 lb 8 oz (90 kg)  ?Height: 5' 2.5" (1.588 m)  ? ?Body mass index is 35.73 kg/m?. ? ?Generalized: Well developed, in no acu

## 2022-02-07 NOTE — Progress Notes (Signed)
It is okay to keep current dose of Paxil 20 mg every morning if she can tolerate, and add nortriptyline 10 mg titrating to higher dose as needed ?

## 2022-02-10 LAB — HEMOGLOBIN A1C
Est. average glucose Bld gHb Est-mCnc: 117 mg/dL
Hgb A1c MFr Bld: 5.7 % — ABNORMAL HIGH (ref 4.8–5.6)

## 2022-02-10 LAB — ANA: ANA Titer 1: NEGATIVE

## 2022-02-10 LAB — C-REACTIVE PROTEIN: CRP: 1 mg/L (ref 0–10)

## 2022-02-10 LAB — SEDIMENTATION RATE: Sed Rate: 12 mm/hr (ref 0–40)

## 2022-02-10 LAB — VITAMIN B12: Vitamin B-12: 472 pg/mL (ref 232–1245)

## 2022-02-17 ENCOUNTER — Encounter: Payer: Self-pay | Admitting: Cardiology

## 2022-02-17 ENCOUNTER — Ambulatory Visit: Payer: 59 | Admitting: Cardiology

## 2022-02-17 VITALS — BP 117/68 | HR 68 | Temp 98.0°F | Resp 16 | Ht 62.5 in | Wt 196.0 lb

## 2022-02-17 DIAGNOSIS — I10 Essential (primary) hypertension: Secondary | ICD-10-CM

## 2022-02-17 DIAGNOSIS — R931 Abnormal findings on diagnostic imaging of heart and coronary circulation: Secondary | ICD-10-CM

## 2022-02-17 DIAGNOSIS — E7800 Pure hypercholesterolemia, unspecified: Secondary | ICD-10-CM

## 2022-02-17 NOTE — Progress Notes (Signed)
Primary Physician/Referring:  Deland Pretty, MD  Patient ID: Jamie Trujillo, female    DOB: 1965-12-19, 56 y.o.   MRN: [id number]  Chief Complaint  Patient presents with   Coronary Artery Disease   Hyperlipidemia   Follow-up    6 months   HPI:    Jamie Trujillo  is a 56 y.o. 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  She presents for a 100-monthoffice visit.  States that she is tolerating statins but has noticed right shoulder pain but she also has diffuse joint pains that are chronic.  She was also evaluated extensively by neurology and neurosurgery with regard to tingling and numbness in her hands and feet.  Past Medical History:  Diagnosis Date   Anxiety    Depression    Essential tremor    Heart murmur 10/03/2007   Hyperlipidemia    Hypertension    Past Surgical History:  Procedure Laterality Date   FSehiliSURGERY  10/02/2001   Removed    Family History  Problem Relation Age of Onset   Breast cancer Mother        over 53  Heart disease Mother    Heart failure Mother    Atrial fibrillation Mother    Heart disease Father    Heart attack Brother    Heart disease Brother     Social History   Tobacco Use   Smoking status: Never   Smokeless tobacco: Never  Substance Use Topics   Alcohol use: Yes    Comment: Occ on weekends    Marital Status: Married  ROS  Review of Systems  Cardiovascular:  Negative for chest pain, dyspnea on exertion and leg swelling.  Objective  Blood pressure 117/68, pulse 68, temperature 98 F (36.7 C), temperature source Temporal, resp. rate 16, height 5' 2.5" (1.588 m), weight 196 lb (88.9 kg), SpO2 97 %. Body mass index is 35.28 kg/m.     02/17/2022    2:43 PM 02/07/2022    7:30 AM 11/15/2021    4:05 PM  Vitals with BMI  Height 5' 2.5" 5' 2.5" 5' 2.5"  Weight 196 lbs 198  lbs 8 oz 197 lbs  BMI 35.26 [phone] Systolic [id number] Diastolic 68 75 88  Pulse 68 91 62    Physical Exam Constitutional:      Appearance: She is obese.  Neck:     Vascular: No carotid bruit or JVD.  Cardiovascular:     Rate and Rhythm: Normal rate and regular rhythm.     Pulses: Intact distal pulses.     Heart sounds: Normal heart sounds. No murmur heard.   No gallop.  Pulmonary:     Effort: Pulmonary effort is normal.     Breath sounds: Normal breath sounds.  Abdominal:     General: Bowel sounds are normal.     Palpations: Abdomen is soft.  Musculoskeletal:     Right lower leg: No edema.     Left lower leg: No edema.     Laboratory examination:    Recent Labs    06/09/21 1420  TSH 1.473    External labs:  Labs 09/13/2021:  A1c 5.8%.  Total cholesterol 152, triglycerides 95, HDL 63, LDL 70.  Non-HDL cholesterol 89.  Hb 15.0/HCT 44.8, platelets 251, normal indicis.  BUN 15, creatinine 0.90,  potassium 4.4, EGFR 83/72 mL, AST 23, ALT 33, CMP otherwise normal.  Labs 03/24/2021: Total cholesterol 236, triglycerides 121, HDL 58, LDL 154. Non-HDL cholesterol 178. BUN 10, creatinine 0.84, EGFR 91/78 mL. Potassium 4.6. CMP otherwise normal. A1c 5.7%.  Labs on 03/17/2021 Lipid Panel: LDL 160, HDL 65, Triglycerides 93, Total Cholesterol 244  Medications and allergies   Allergies  Allergen Reactions   Penicillins      Medication prior to this encounter:   Medication list after today's encounter    Current Outpatient Medications:    aspirin (ASPIRIN CHILDRENS) 81 MG chewable tablet, Chew 1 tablet (81 mg total) by mouth daily., Disp: , Rfl:    betamethasone valerate (VALISONE) 0.1 % cream, Apply 1 application topically daily as needed (itching on ear)., Disp: , Rfl:    clorazepate (TRANXENE) 7.5 MG tablet, TAKE 1 CAPSULE  DAILY MONDAY THRU THURSDAY AND IF NEEDED ON OTHER DAYS., Disp: 90 tablet, Rfl: 1   ezetimibe (ZETIA) 10 MG tablet, Take 1 tablet  (10 mg total) by mouth daily after supper., Disp: 90 tablet, Rfl: 3   fluocinonide (LIDEX) 0.05 % external solution, Apply 2 application topically daily as needed (for itching). 2 drops in each ear as needed for itching, Disp: , Rfl:    IBUPROFEN PO, Take 600 mg by mouth daily as needed (takes 1-3 x week. (649m tablet) pain)., Disp: , Rfl:    metoprolol tartrate (LOPRESSOR) 50 MG tablet, Take 1 tablet (50 mg total) by mouth 2 (two) times daily., Disp: 180 tablet, Rfl: 3   Multiple Vitamins-Minerals (MULTIVITAMIN PO), Take 1 tablet by mouth daily., Disp: , Rfl:    nitroGLYCERIN (NITROSTAT) 0.4 MG SL tablet, Place 1 tablet (0.4 mg total) under the tongue every 5 (five) minutes as needed for up to 25 days for chest pain., Disp: 25 tablet, Rfl: 3   olmesartan-hydrochlorothiazide (BENICAR HCT) 20-12.5 MG tablet, Take 0.5 tablets by mouth every morning. Hold on weekends, Disp: 90 tablet, Rfl: 3   omeprazole (PRILOSEC) 20 MG capsule, Take 20 mg by mouth daily., Disp: , Rfl:    PARoxetine (PAXIL) 20 MG tablet, Take 1 tablet (20 mg total) by mouth daily. (Patient taking differently: Take 20 mg by mouth See admin instructions. Takes 1 tablet daily Monday through Thursday), Disp: 90 tablet, Rfl: 3   primidone (MYSOLINE) 50 MG tablet, TAKE 1 TABLET BY MOUTH  DAILY AND AN ADDITIONAL 1/2 TABLET DAILY AS NEEDED, Disp: 135 tablet, Rfl: 4   Propylene Glycol (SYSTANE BALANCE OP), Place 1 drop into both eyes daily as needed (dry eyes)., Disp: , Rfl:    rosuvastatin (CRESTOR) 20 MG tablet, Take 1 tablet (20 mg total) by mouth daily., Disp: 90 tablet, Rfl: 3   saccharomyces boulardii (FLORASTOR) 250 MG capsule, Take 250 mg by mouth daily. , Disp: , Rfl:    Radiology:   CT angiogram of the chest and abdomen 06/09/2021: 1. Normal contour and caliber of the thoracic and abdominal aorta. No evidence of aneurysm, dissection, or other acute aortic pathology. Mild, scattered aortic atherosclerosis. 2. Coronary artery  disease. 3. Hepatic steatosis. 4. Status post cholecystectomy. 5. Descending and sigmoid diverticulosis without evidence of acute diverticulitis.  Cardiac Studies:   PCV ECHOCARDIOGRAM COMPLETE 05/13/2021 Left ventricle cavity is normal in size. Mild concentric hypertrophy of the left ventricle. Normal global wall motion. Normal LV systolic function with visual EF 50-55%. Normal diastolic filling pattern. Mild (Grade I) mitral regurgitation. Unlike study in 2015, PFO not appreciated.    PCV  MYOCARDIAL PERFUSION WO LEXISCAN 05/23/2021 Exercise nuclear stress test was performed using Bruce protocol. Patient reached 6.2 METS, and 88% of age predicted maximum heart rate. Exercise capacity was low. No chest pain reported. Heart rate and hemodynamic response were normal. Stress EKG revealed no ischemic changes. Normal myocardial perfusion. Stress LVEF calculated 48%, although visually appears normal. Low risk study.  Coronary CTA FFR 06/24/2021: Coronary artery calcification score: Left main: 0 Left anterior descending artery: 118 Left circumflex artery: 29.3 Right coronary artery: 0.3 Total coronary calcium score of 148, places the patient at the 97th percentile for age and sex matched control.   Left main: Normal. LAD: Moderate calcified plaque 50 to 69% within the proximal/ostial LAD.  2 diagonal branches. LCx: Mild 25 to 49% calcific plaque at the ostium of OM1. RCA: Dominant.  Minimal stenosis of 0 to 24% due to mixed plaque within proximal RCA.   1. Left Main: FFR = 0.98 2. LAD: Proximal FFR = 0.96, mid FFR = 0.94, distal FFR = 0.91 3. LCX: Proximal FFR = 0.98, distal FFR = 0.97 4. RCA: Proximal FFR = 0.99, mid FFR =0.96, distal FFR = 0.96 IMPRESSION: 1.  CT FFR analysis showed no significant stenosis.    EKG:   06/09/2021: Normal sinus rhythm at a rate of 60 bpm.  Normal axis.  No evidence of ischemia or underlying injury pattern.  EKG 04/29/2021: Normal sinus rhythm with rate  of 67 bpm, normal axis.  No evidence of ischemia, normal EKG.    Assessment     ICD-10-CM   1. Elevated coronary artery calcium score 06/24/2021: Total score 148, 97th percentile MESA Database  R93.1     2. Primary hypertension  I10 EKG 12-Lead    3. Hypercholesteremia  E78.00       Medications Discontinued During This Encounter  Medication Reason   cyclobenzaprine (FLEXERIL) 10 MG tablet      No orders of the defined types were placed in this encounter.   Orders Placed This Encounter  Procedures   EKG 12-Lead     Recommendations:   Jamie Trujillo is a 56 y.o. 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  She presents for a 31-monthoffice visit.  States that she is tolerating statins but has noticed right shoulder pain but she also has diffuse joint pains that are chronic.  Advised her that as she has vitamin D deficiency, she should go on 3000 units of vitamin D and discontinue Crestor for now for 3 weeks then restart Crestor after 3 weeks of vitamin D supplementation to see if symptoms still persist.  If she has relief of pain, we could try to switch her to a different statin but if there is no correlation, would like to continue present statin due to excellent response and LDL is now at goal at <70.  Blood pressure is well controlled, her LFTs have also improved since last office visit.  There is no change in her weight, I discussed with her regarding Wegovy and to discuss with PCP.  As she remains asymptomatic from cardiac standpoint without any chest pain or dyspnea, I will see her back on an annual basis.     I spent 40 minutes face-to-face encounter, discussions regarding primary prevention, discussions regarding her abnormal LFTs previously and fatty liver, weight loss, evaluation and look at the records of neurologic evaluation and  coordination of care  along with review of external labs.    Adrian Prows, MD, [facility] 02/17/2022, 2:59 PM Office: [phone] Fax: [phone] Pager: [phone]

## 2022-05-02 ENCOUNTER — Other Ambulatory Visit: Payer: Self-pay | Admitting: Neurology

## 2022-05-03 NOTE — Telephone Encounter (Signed)
Rx refilled.

## 2022-05-05 ENCOUNTER — Telehealth: Payer: Self-pay

## 2022-05-05 DIAGNOSIS — E7800 Pure hypercholesterolemia, unspecified: Secondary | ICD-10-CM

## 2022-05-10 MED ORDER — ATORVASTATIN CALCIUM 40 MG PO TABS: 40.0000 mg | ORAL_TABLET | Freq: Every day | ORAL | 3 refills | Status: DC

## 2022-05-10 NOTE — Telephone Encounter (Signed)
ICD-10-CM   1. Hypercholesteremia  E78.00 atorvastatin (LIPITOR) 40 MG tablet     Medications Discontinued During This Encounter  Medication Reason   rosuvastatin (CRESTOR) 20 MG tablet Side effect (s)   Meds ordered this encounter  Medications   atorvastatin (LIPITOR) 40 MG tablet    Sig: Take 1 tablet (40 mg total) by mouth daily.    Dispense:  90 tablet    Refill:  3    Discontinue Crestor due to myalgias

## 2022-05-11 NOTE — Telephone Encounter (Signed)
Spoke to patient she is aware

## 2022-06-20 ENCOUNTER — Other Ambulatory Visit: Payer: Self-pay | Admitting: Internal Medicine

## 2022-06-20 DIAGNOSIS — Z1231 Encounter for screening mammogram for malignant neoplasm of breast: Secondary | ICD-10-CM

## 2022-07-11 ENCOUNTER — Telehealth: Payer: Self-pay | Admitting: Neurology

## 2022-07-11 NOTE — Telephone Encounter (Signed)
Records faxed to [facility] fax # [phone]

## 2022-07-24 ENCOUNTER — Ambulatory Visit
Admission: RE | Admit: 2022-07-24 | Discharge: 2022-07-24 | Disposition: A | Discharge status: Home / Self Care | Payer: 59 | Source: Ambulatory Visit | Attending: Internal Medicine | Admitting: Internal Medicine

## 2022-07-24 DIAGNOSIS — Z1231 Encounter for screening mammogram for malignant neoplasm of breast: Secondary | ICD-10-CM

## 2022-08-04 ENCOUNTER — Other Ambulatory Visit: Payer: Self-pay | Admitting: Cardiology

## 2022-08-04 DIAGNOSIS — I10 Essential (primary) hypertension: Secondary | ICD-10-CM

## 2022-08-06 ENCOUNTER — Other Ambulatory Visit: Payer: Self-pay | Admitting: Cardiology

## 2022-08-06 DIAGNOSIS — E7800 Pure hypercholesterolemia, unspecified: Secondary | ICD-10-CM

## 2022-08-28 ENCOUNTER — Telehealth: Payer: Self-pay

## 2022-08-28 DIAGNOSIS — R931 Abnormal findings on diagnostic imaging of heart and coronary circulation: Secondary | ICD-10-CM

## 2022-08-28 DIAGNOSIS — E7800 Pure hypercholesterolemia, unspecified: Secondary | ICD-10-CM

## 2022-08-28 MED ORDER — SIMVASTATIN 40 MG PO TABS: 40.0000 mg | ORAL_TABLET | Freq: Every day | ORAL | 2 refills | Status: DC

## 2022-08-28 NOTE — Telephone Encounter (Signed)
Patient left a voicemail letting us know the Lipitor is causing her to have right shoulder pain.  She has stopped taking it for about a month now. According to the patient, her father has the same issue, and Zocor works for him. She is open to trying the Zocor but is not willing to continue to Lipitor.

## 2022-08-28 NOTE — Telephone Encounter (Signed)
Hypercholesteremia - Plan: simvastatin (ZOCOR) 40 MG tablet  Elevated coronary artery calcium score - Plan: simvastatin (ZOCOR) 40 MG tablet  Medications Discontinued During This Encounter  Medication Reason   atorvastatin (LIPITOR) 40 MG tablet Side effect (s)    Meds ordered this encounter  Medications   simvastatin (ZOCOR) 40 MG tablet    Sig: Take 1 tablet (40 mg total) by mouth daily at 6 PM.    Dispense:  30 tablet    Refill:  2    Discontinue atorvastatin

## 2022-08-28 NOTE — Telephone Encounter (Signed)
Rx sent 

## 2022-09-18 NOTE — Progress Notes (Deleted)
Patient: Jamie Trujillo Date of Birth: [birth year]  Reason for Visit: Follow up History from: Patient Primary Neurologist: Dr. Krista Blue   ASSESSMENT AND PLAN 56 y.o. year old female   Right hand paresthesia, left hand numbness Neck pain, radiating pain to right shoulder Chronic low back pain -NCV/EMG March 2023 essentially normal -MRI cervical spine showed multilevel mild degenerative changes, no evidence of significant canal or foraminal stenosis -Continue Flexeril as needed, could benefit from PT, neuromuscular therapy, massage, unfortunately is very limited on time with work stressors -Check labs CRP, ESR, ANA, B12, A1c  4. Essential Tremor -Stable, current regimen works well -Continue Tranxene 7.5 mg tablet, once daily Monday-Thursday, if needed other days -Continue primidone 50 mg daily, additional half tablet daily PRN -For now, continue Paxil 20 mg daily, may consider addition of nortriptyline to help with mood, sleeping, neuromuscular pain   HISTORY  Jamie Trujillo is a 56 year old female, seen in request by Dr. Doreatha Martin, for evaluation of numbness worsening neck pain, her primary care physician is Dr. Shelia Media, [facility], initial evaluation was on November 15, 2021   I reviewed and summarized the referring note. PMHX. 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  In 2023, she also noticed left toe numbness, she also complains of pain from her back  radiating to her spine, muscle tightness,  She denies gait abnormality, denies incontinence  X-ray of cervical spine showed cervical degenerative changes,   She also presented to emergency room September 2022 for significant headache, dizziness, chest pain  Was found to have significantly elevated blood pressure 182/113, now on polypharmacy treatment for her blood pressure, also hyperlipidemia,  She was also seen by cardiologist Dr. GNadyne Coombes evidence of coronary artery disease on aspirin 81 mg daily  Since then she was started on Crestor 20 mg daily, complains of diffuse body achiness, lack of stamina,   Update December 12, 2021: Electrodiagnostic study today showed no significant abnormality. She continued complaints of significant low back pain, difficult change of position, causing mild gait difficulty  In addition, she is most concerned about her right thumb paresthesia, right elbow pain, especially when she tried to pop up her body using right elbow  Personally reviewed MRI of cervical spine, mild degenerative changes, no significant canal or foraminal narrowing  Reviewed laboratory evaluations A1c 5.8, normal LDL, TSH, CMP, CBC  Update Feb 07, 2022 SS: Work stress, continues slight paresthesia to right 1-2 fingers, has pain in neck to left and right side, left > right, also in left great toe. Chronic low back pain. Doesn't like taking medications, makes her sleepy. Takes Flexeril if back is bad, if absolutely necessary. Not sleeping well, a lot of stress. Tremor is stable.  Update September 19, 2022 SS:   REVIEW OF SYSTEMS: Out of a complete 14 system review of symptoms, the patient complains only of the following symptoms, and all other reviewed systems are negative.  See HPI  ALLERGIES: Allergies  Allergen Reactions   Atorvastatin Calcium Other (See  Comments)    Myalgia   Penicillins     HOME MEDICATIONS: Outpatient Medications Prior to Visit  Medication Sig Dispense Refill    aspirin (ASPIRIN CHILDRENS) 81 MG chewable tablet Chew 1 tablet (81 mg total) by mouth daily.     betamethasone valerate (VALISONE) 0.1 % cream Apply 1 application topically daily as needed (itching on ear).     clorazepate (TRANXENE) 7.5 MG tablet TAKE 1 CAPSULE  DAILY MONDAY THRU THURSDAY AND IF NEEDED ON OTHER DAYS. 90 tablet 1   ezetimibe (ZETIA) 10 MG tablet TAKE 1 TABLET (10 MG TOTAL) BY MOUTH DAILY AFTER SUPPER. 90 tablet 3   fluocinonide (LIDEX) 0.05 % external solution Apply 2 application topically daily as needed (for itching). 2 drops in each ear as needed for itching     IBUPROFEN PO Take 600 mg by mouth daily as needed (takes 1-3 x week. (646m tablet) pain).     metoprolol tartrate (LOPRESSOR) 50 MG tablet Take 1 tablet (50 mg total) by mouth 2 (two) times daily. 180 tablet 3   Multiple Vitamins-Minerals (MULTIVITAMIN PO) Take 1 tablet by mouth daily.     nitroGLYCERIN (NITROSTAT) 0.4 MG SL tablet Place 1 tablet (0.4 mg total) under the tongue every 5 (five) minutes as needed for up to 25 days for chest pain. 25 tablet 3   olmesartan-hydrochlorothiazide (BENICAR HCT) 20-12.5 MG tablet TAKE 0.5 TABLETS BY MOUTH EVERY MORNING. HOLD ON WEEKENDS 45 tablet 3   omeprazole (PRILOSEC) 20 MG capsule Take 20 mg by mouth daily.     PARoxetine (PAXIL) 20 MG tablet TAKE 1 TABLET BY MOUTH EVERY DAY 90 tablet 3   primidone (MYSOLINE) 50 MG tablet TAKE 1 TABLET BY MOUTH  DAILY AND AN ADDITIONAL 1/2 TABLET DAILY AS NEEDED 135 tablet 4   Propylene Glycol (SYSTANE BALANCE OP) Place 1 drop into both eyes daily as needed (dry eyes).     saccharomyces boulardii (FLORASTOR) 250 MG capsule Take 250 mg by mouth daily.      simvastatin (ZOCOR) 40 MG tablet Take 1 tablet (40 mg total) by mouth daily at 6 PM. 30 tablet 2   No facility-administered medications prior to visit.    PAST MEDICAL HISTORY: Past Medical History:  Diagnosis Date   Anxiety    Depression    Essential tremor    Heart murmur  10/03/2007   Hyperlipidemia    Hypertension     PAST SURGICAL HISTORY: Past Surgical History:  Procedure Laterality Date   FINGER SURGERY  1990   GALLBLADDER SURGERY  10/02/2001   Removed     FAMILY HISTORY: Family History  Problem Relation Age of Onset   Breast cancer Mother        over 5109  Heart disease Mother    Heart failure Mother    Atrial fibrillation Mother    Heart disease Father    Heart attack Brother    Heart disease Brother     SOCIAL HISTORY: Social History   Socioeconomic History   Marital status: Married    Spouse name: DTimmothy Sours   Number of children: 0   Years of education: 12+   Highest education level: Not on file  Occupational History   Occupation: Oral Surgon  Tobacco Use   Smoking status: Never   Smokeless tobacco: Never  Vaping Use   Vaping Use: Never used  Substance and Sexual Activity   Alcohol use: Yes    Comment: Occ on weekends    Drug  use: No   Sexual activity: Not on file  Other Topics Concern   Not on file  Social History Narrative   Patient is an Chief Financial Officer.    Patient lives at home with husband Elvia Collum.    Patient has no children.    Patient is working at a [employer].    Patient is right handed.    Social Determinants of Health   Financial Resource Strain: Not on file  Food Insecurity: Not on file  Transportation Needs: Not on file  Physical Activity: Not on file  Stress: Not on file  Social Connections: Not on file  Intimate Partner Violence: Not on file   PHYSICAL EXAM  There were no vitals filed for this visit.  There is no height or weight on file to calculate BMI.  Generalized: Well developed, in no acute distress, tired looking Neurological examination  Mentation: Alert oriented to time, place, history taking. Follows all commands speech and language fluent Cranial nerve II-XII: Pupils were equal round reactive to light. Extraocular movements were full, visual field were full on confrontational  test. Facial sensation and strength were normal. Head turning and shoulder shrug  were normal and symmetric. Motor: The motor testing reveals 5 over 5 strength of all 4 extremities. Good symmetric motor tone is noted throughout. Strong grip strength bilaterally Sensory: Sensory testing is intact to soft touch on all 4 extremities. No evidence of extinction is noted.  Coordination: Cerebellar testing reveals good finger-nose-finger and heel-to-shin bilaterally.  Gait and station: Gait is normal. Tandem gait is slightly unsteady. Reflexes: Deep tendon reflexes are symmetric but increased   DIAGNOSTIC DATA (LABS, IMAGING, TESTING) - I reviewed patient records, labs, notes, testing and imaging myself where available.  Lab Results  Component Value Date   WBC 9.0 06/09/2021   HGB 14.6 06/09/2021   HCT 43.2 06/09/2021   MCV 94.7 06/09/2021   PLT 304 06/09/2021      Component Value Date/Time   NA 142 06/16/2021 0757   K 5.0 06/16/2021 0757   CL 99 06/16/2021 0757   CO2 24 06/16/2021 0757   GLUCOSE 92 06/16/2021 0757   GLUCOSE 97 06/09/2021 1320   BUN 17 06/16/2021 0757   CREATININE 0.98 06/16/2021 0757   CALCIUM 10.1 06/16/2021 0757   PROT 7.4 06/09/2021 1320   ALBUMIN 4.1 06/09/2021 1320   AST 25 06/09/2021 1320   ALT 30 06/09/2021 1320   ALKPHOS 68 06/09/2021 1320   BILITOT 0.7 06/09/2021 1320   GFRNONAA >60 06/09/2021 1320   Lab Results  Component Value Date   CHOL 167 07/13/2021   HDL 59 07/13/2021   LDLCALC 89 07/13/2021   LDLDIRECT 81 07/13/2021   TRIG 108 07/13/2021   Lab Results  Component Value Date   HGBA1C 5.7 (H) 02/07/2022   Lab Results  Component Value Date   EKCMKLKJ17 915 02/07/2022   Lab Results  Component Value Date   TSH 1.473 06/09/2021    Butler Denmark, AGNP-C, DNP 09/18/2022, 5:52 AM [facility] [address], [place], [place] [address] [phone]

## 2022-09-19 ENCOUNTER — Ambulatory Visit: Payer: 59 | Admitting: Neurology

## 2022-09-30 IMAGING — CT CT ANGIO CHEST-ABD-PELV FOR DISSECTION W/ AND WO/W CM
2 of 8 series · 13 of 46 positions shown, 15 images · IV contrast (APPLIED)
Comparison: CT abdomen pelvis, 02/22/2015

CLINICAL DATA: Chest and back pain, dizziness, chest pressure,
shortness of breath, rule out aortic dissection

EXAM:
CT ANGIOGRAPHY CHEST, ABDOMEN AND PELVIS
TECHNIQUE: Non-contrast CT of the chest was initially obtained.

[Series 7: axial arterial · axial · arterial · 0.76mm/px · z∈[+997,+1510]mm · 10 of 210 slices shown, 12 images]
[im 20/210  soft-tissue]
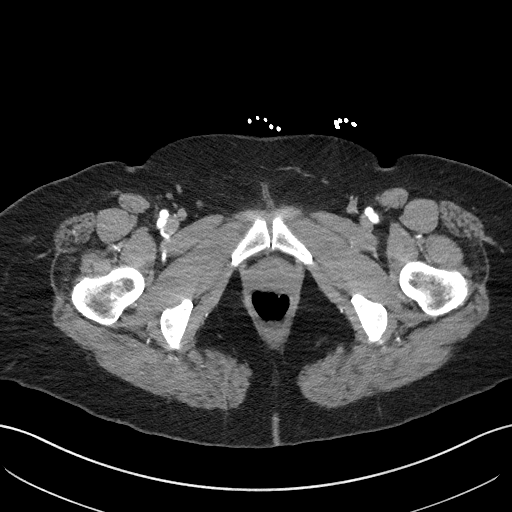
[im 20/210  bone]
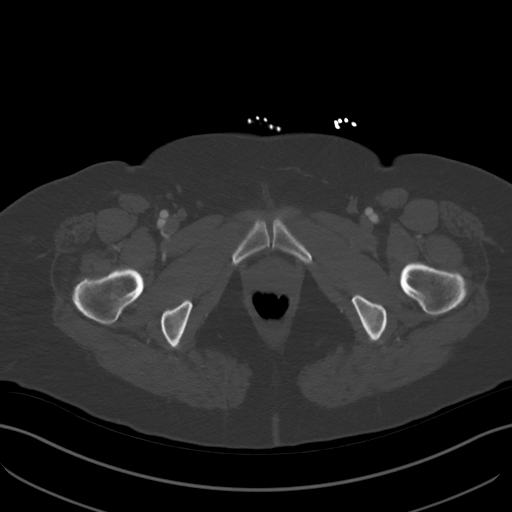
[im 39/210  soft-tissue]
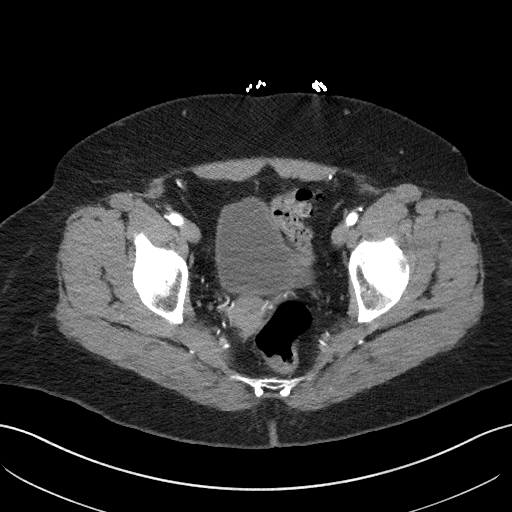
[im 58/210  soft-tissue]
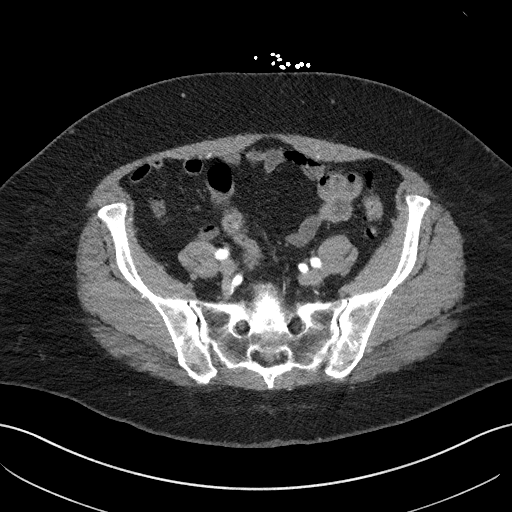
[im 77/210  soft-tissue]
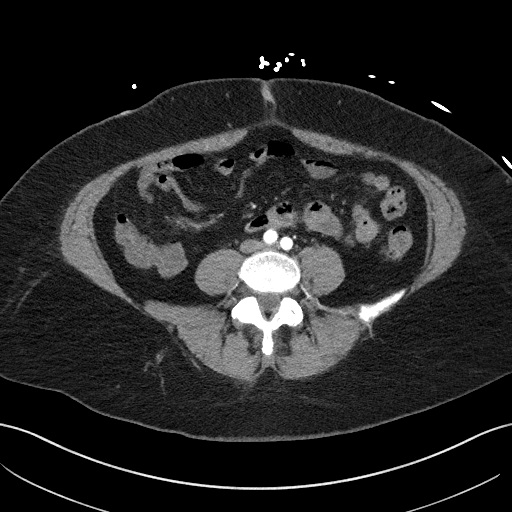
[im 96/210  soft-tissue]
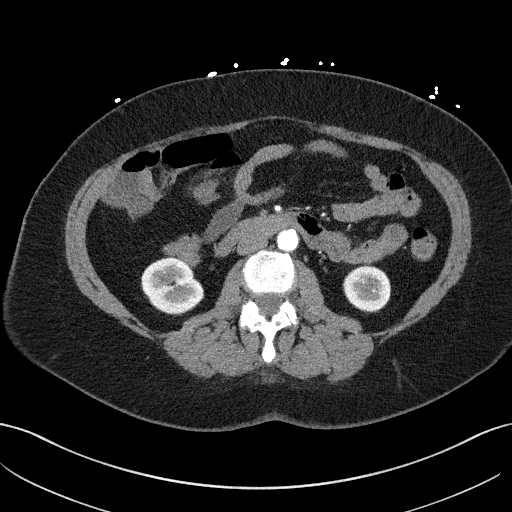
[im 115/210  soft-tissue]
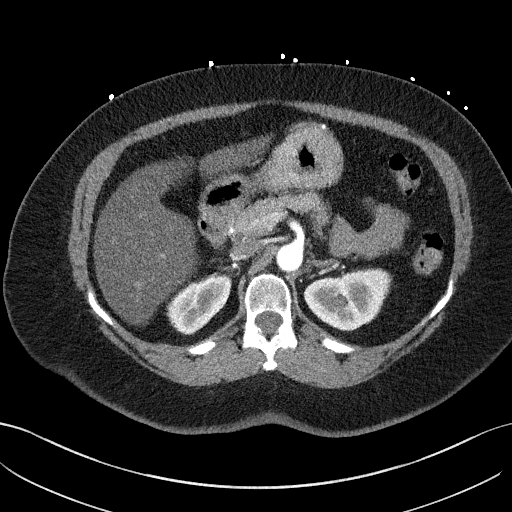
[im 134/210  soft-tissue]
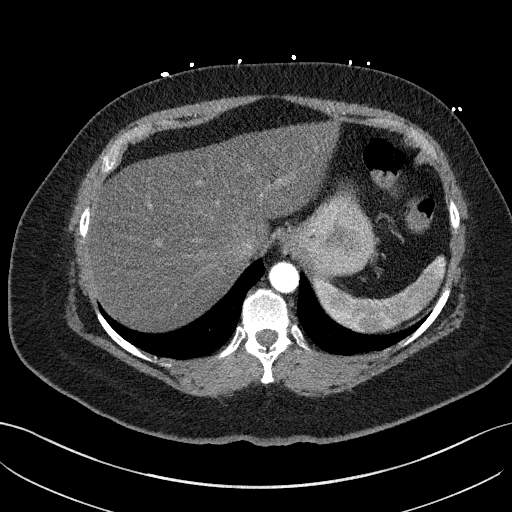
[im 153/210  soft-tissue]
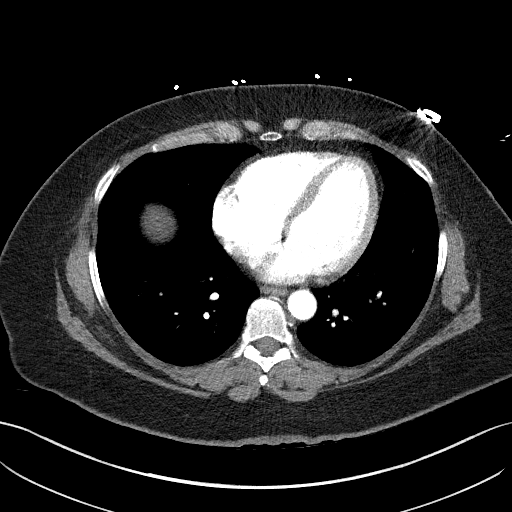
[im 172/210  soft-tissue]
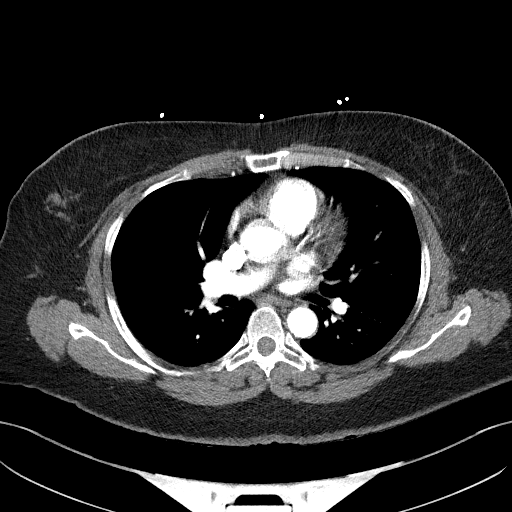
[im 172/210  bone]
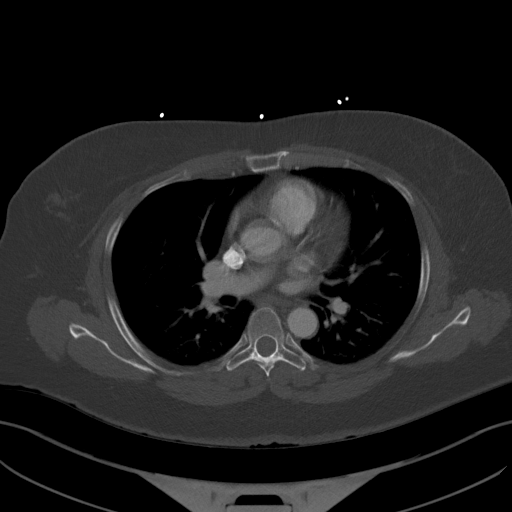
[im 191/210  soft-tissue]
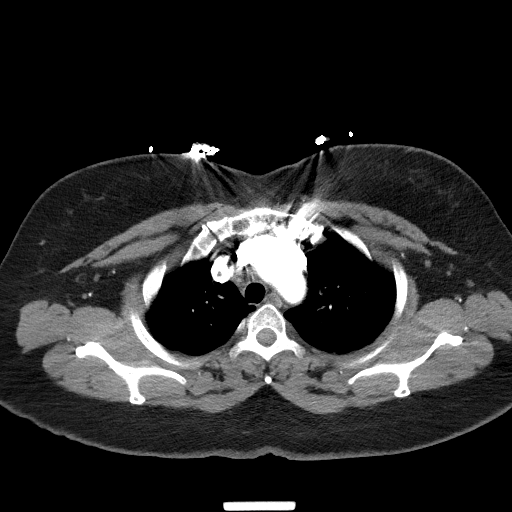

[Series 10: coronals · coronal · 0.79mm/px · 3 of 149 slices shown]
[im 38/149  soft-tissue]
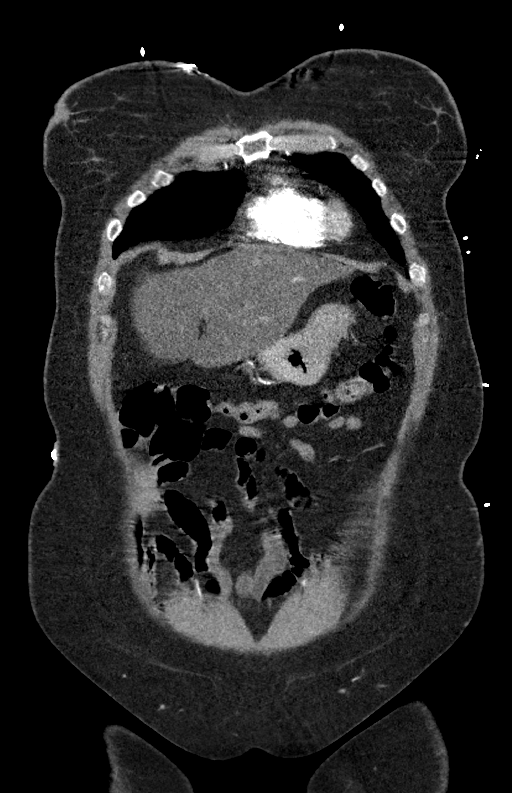
[im 75/149  soft-tissue]
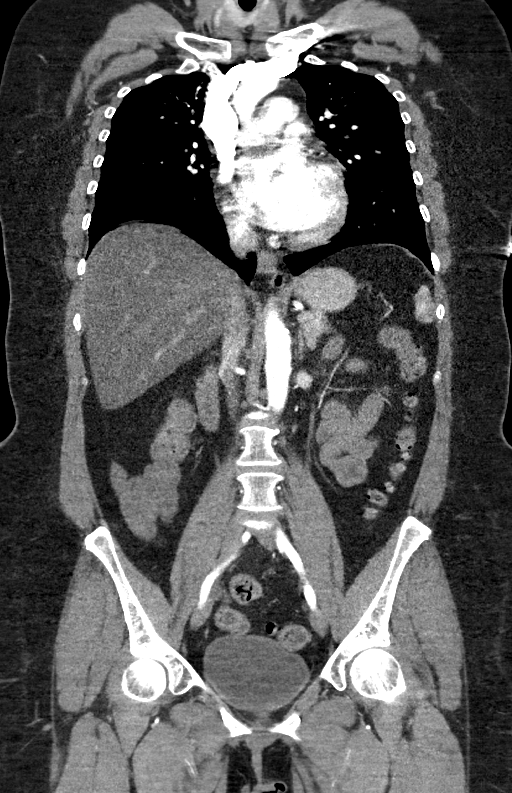
[im 112/149  soft-tissue]
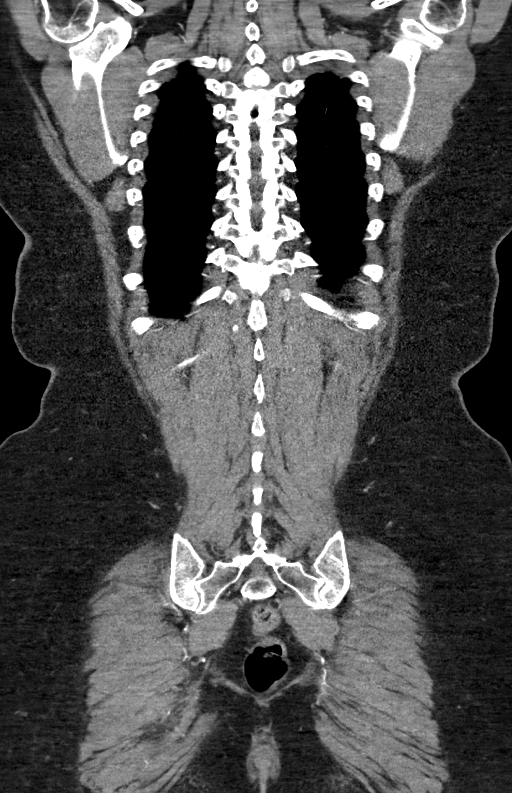

[13 of 46 positions shown; findings below may reference images not displayed]

Multidetector CT imaging through the chest, abdomen and pelvis was
performed using the standard protocol during bolus administration of
intravenous contrast. Multiplanar reconstructed images and MIPs were
obtained and reviewed to evaluate the vascular anatomy.

CONTRAST:  100mL OMNIPAQUE IOHEXOL 350 MG/ML SOLN
FINDINGS: CTA CHEST FINDINGS

Cardiovascular: Preferential opacification of the thoracic aorta.
Normal contour and caliber of the thoracic aorta. No evidence of
aneurysm, dissection, or other acute aortic pathology. Mild,
scattered aortic atherosclerosis. Normal heart size. Left coronary
artery calcifications. No pericardial effusion.

Mediastinum/Nodes: No enlarged mediastinal, hilar, or axillary lymph
nodes. Small hiatal hernia. Thyroid gland, trachea, and esophagus
demonstrate no significant findings.

Lungs/Pleura: Lungs are clear. No pleural effusion or pneumothorax.

Musculoskeletal: No chest wall abnormality. No acute or significant
osseous findings.

Review of the MIP images confirms the above findings.

CTA ABDOMEN AND PELVIS FINDINGS

VASCULAR

Normal contour and caliber of the abdominal aorta. No evidence of
aneurysm, dissection, or other acute aortic pathology. Small,
duplicated inferior pole left renal artery with a solitary right
renal artery. Otherwise standard branching pattern of the abdominal
aorta. Mild, scattered aortic atherosclerosis.

Review of the MIP images confirms the above findings.

NON-VASCULAR

Hepatobiliary: No solid liver abnormality is seen. Hepatic
steatosis. Status post cholecystectomy. No biliary ductal
dilatation.

Pancreas: Unremarkable. No pancreatic ductal dilatation or
surrounding inflammatory changes.

Spleen: Normal in size without significant abnormality. Small
accessory splenule.

Adrenals/Urinary Tract: Adrenal glands are unremarkable. Kidneys are
normal, without renal calculi, solid lesion, or hydronephrosis.
Bladder is unremarkable.

Stomach/Bowel: Stomach is within normal limits. Appendix appears
normal. No evidence of bowel wall thickening, distention, or
inflammatory changes. Descending and sigmoid diverticulosis.

Lymphatic: No enlarged abdominal or pelvic lymph nodes.

Reproductive: No mass or other significant abnormality.

Other: No abdominal wall hernia or abnormality. No abdominopelvic
ascites.

Musculoskeletal: No acute or significant osseous findings.

Review of the MIP images confirms the above findings.
IMPRESSION: 1. Normal contour and caliber of the thoracic and abdominal aorta.
No evidence of aneurysm, dissection, or other acute aortic
pathology. Mild, scattered aortic atherosclerosis.
2. Coronary artery disease.
3. Hepatic steatosis.
4. Status post cholecystectomy.
5. Descending and sigmoid diverticulosis without evidence of acute
diverticulitis.

Aortic Atherosclerosis (ANOTC-T3A.A).

## 2022-09-30 IMAGING — CR DG CHEST 2V
2 series · 2 of 2 positions shown · non-contrast
Comparison: None.

CLINICAL DATA: cp

EXAM:
CHEST - 2 VIEW

[w chest pa]
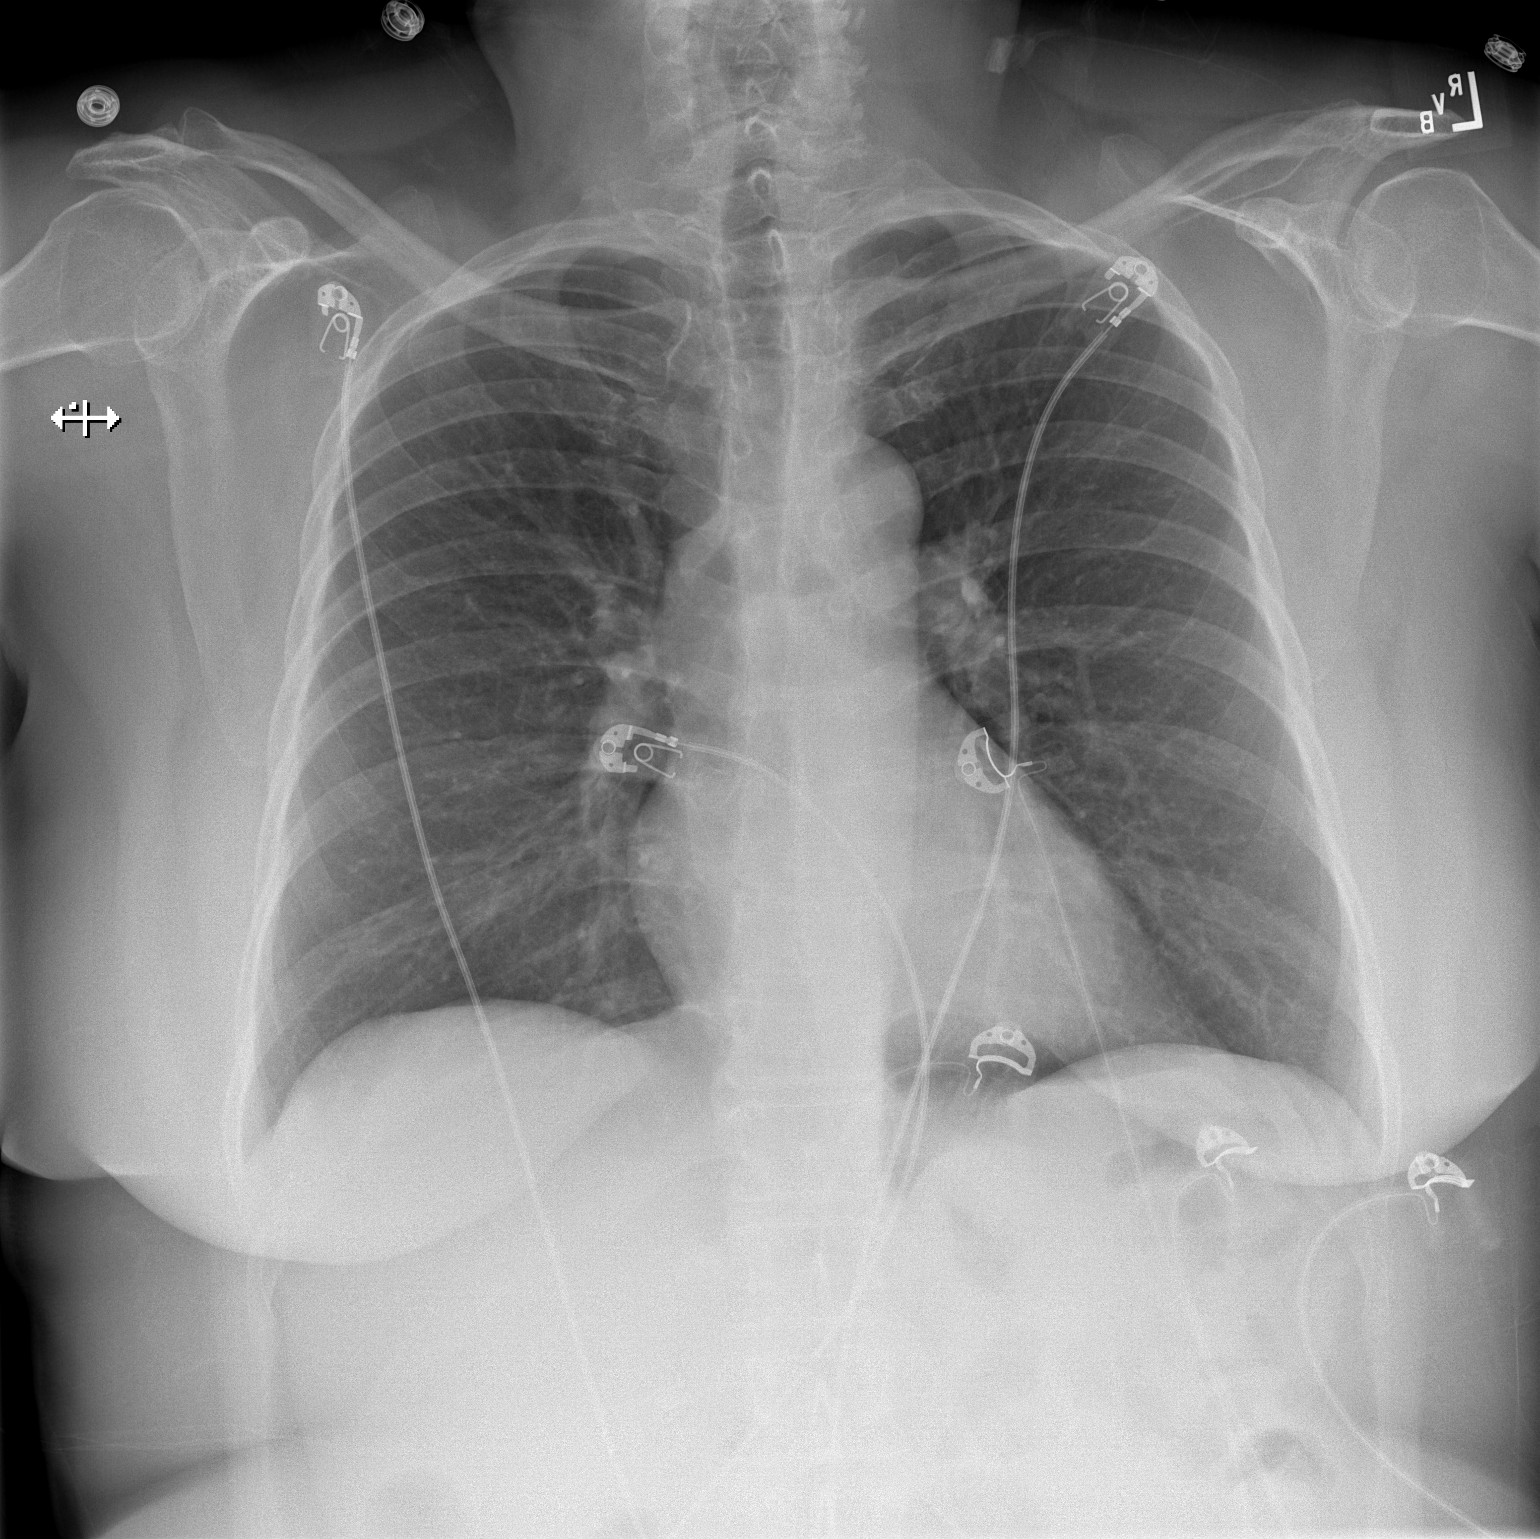

[w chest lat]
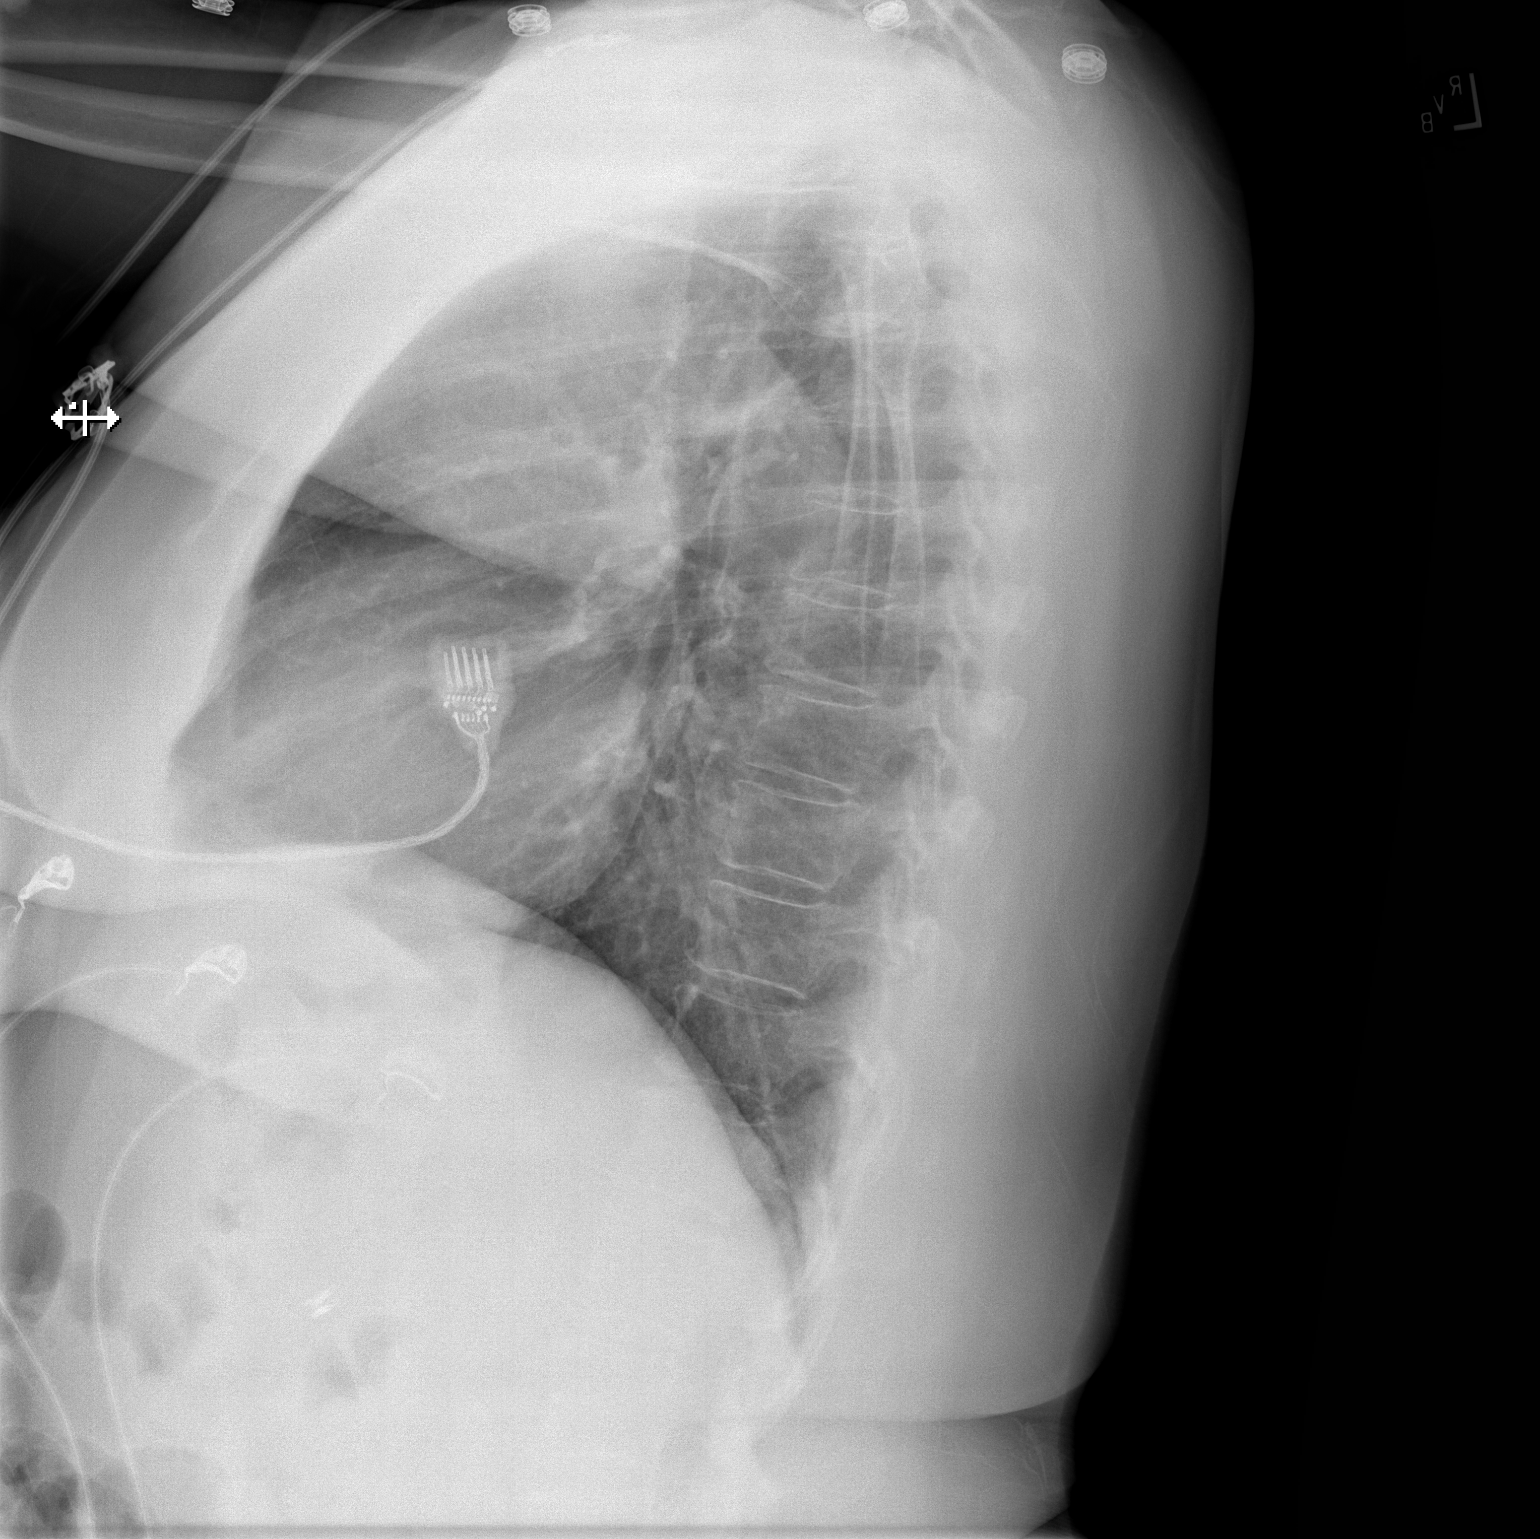

[2 of 2 positions shown; findings below may reference images not displayed]

FINDINGS: The cardiomediastinal silhouette is within normal limits. No pleural
effusion. No pneumothorax. No mass or consolidation. No acute
osseous abnormality.
IMPRESSION: No acute abnormality in the chest.

## 2022-10-01 ENCOUNTER — Encounter: Payer: Self-pay | Admitting: Emergency Medicine

## 2022-10-01 ENCOUNTER — Ambulatory Visit
Admission: EM | Admit: 2022-10-01 | Discharge: 2022-10-01 | Disposition: A | Discharge status: Home / Self Care | Payer: 59 | Attending: Internal Medicine | Admitting: Internal Medicine

## 2022-10-01 DIAGNOSIS — J029 Acute pharyngitis, unspecified: Secondary | ICD-10-CM | POA: Diagnosis present

## 2022-10-01 DIAGNOSIS — J111 Influenza due to unidentified influenza virus with other respiratory manifestations: Secondary | ICD-10-CM | POA: Diagnosis present

## 2022-10-01 LAB — POCT RAPID STREP A (OFFICE): Rapid Strep A Screen: NEGATIVE

## 2022-10-01 MED ORDER — FLUTICASONE PROPIONATE 50 MCG/ACT NA SUSP: 1.0000 | Freq: Every day | NASAL | 0 refills | Status: DC

## 2022-10-01 MED ORDER — GUAIFENESIN 200 MG PO TABS: 200.0000 mg | ORAL_TABLET | ORAL | 0 refills | Status: DC | PRN

## 2022-10-01 MED ORDER — CHLORASEPTIC 1.4 % MT LIQD: 1.0000 | OROMUCOSAL | 0 refills | Status: DC | PRN

## 2022-10-01 NOTE — ED Provider Notes (Signed)
[facility]    CSN: [id number] Arrival date & time: 10/01/22  1422      History   Chief Complaint Chief Complaint  Patient presents with   Nasal Congestion    HPI Dannielle KarvonenCarol E Buch is a 56 y.o. female.   Patient presents with 5-day history of nasal congestion, fever, generalized bodyaches, cough. Temp max at home was 102 few days prior.  Reports that her mom recently tested positive for influenza.  Denies chest pain, shortness of breath, nausea, vomiting, diarrhea, abdominal pain.  Does report that she has a significant sore throat.  Has taken over-the-counter cold and flu medication with minimal improvement in symptoms.  Denies history of asthma or COPD.     Past Medical History:  Diagnosis Date   Anxiety    Depression    Essential tremor    Heart murmur 10/03/2007   Hyperlipidemia    Hypertension     Patient Active Problem List   Diagnosis Date Noted   Chronic pain 11/15/2021   Neck pain 11/15/2021   Paresthesia 11/15/2021   Precordial pain    Hypertension 03/22/2021   Elevated triglycerides with high cholesterol 03/22/2021   Gastroesophageal reflux disease 05/03/2019   Anxiety 07/30/2017   Essential tremor 03/30/2014   Neck muscle strain 03/30/2014    Past Surgical History:  Procedure Laterality Date   FINGER SURGERY  1990   GALLBLADDER SURGERY  10/02/2001   Removed     OB History   No obstetric history on file.      Home Medications    Prior to Admission medications   Medication Sig Start Date End Date Taking? Authorizing Provider  fluticasone (FLONASE) 50 MCG/ACT nasal spray Place 1 spray into both nostrils daily. 10/01/22  Yes Zahi Plaskett, Rolly SalterHaley E, FNP  guaiFENesin 200 MG tablet Take 1 tablet (200 mg total) by mouth every 4 (four) hours as needed for cough or to loosen phlegm. 10/01/22  Yes Veida Spira, Rolly SalterHaley E, FNP  phenol (CHLORASEPTIC) 1.4 % LIQD Use as directed 1 spray in the mouth or throat as needed for throat irritation / pain. 10/01/22   Yes Abbrielle Batts, Acie FredricksonHaley E, FNP  aspirin (ASPIRIN CHILDRENS) 81 MG chewable tablet Chew 1 tablet (81 mg total) by mouth daily. 04/29/21   Yates DecampGanji, Jay, MD  betamethasone valerate (VALISONE) 0.1 % cream Apply 1 application topically daily as needed (itching on ear). 09/21/14   [provider]  clorazepate (TRANXENE) 7.5 MG tablet TAKE 1 CAPSULE  DAILY MONDAY THRU THURSDAY AND IF NEEDED ON OTHER DAYS. 12/15/21   Glean SalvoSlack, Sarah J, NP  ezetimibe (ZETIA) 10 MG tablet TAKE 1 TABLET (10 MG TOTAL) BY MOUTH DAILY AFTER SUPPER. 08/07/22 08/02/23  Yates DecampGanji, Jay, MD  fluocinonide (LIDEX) 0.05 % external solution Apply 2 application topically daily as needed (for itching). 2 drops in each ear as needed for itching    [provider]  IBUPROFEN PO Take 600 mg by mouth daily as needed (takes 1-3 x week. (600mg  tablet) pain).    [provider]  metoprolol tartrate (LOPRESSOR) 50 MG tablet Take 1 tablet (50 mg total) by mouth 2 (two) times daily. 07/29/21   Yates DecampGanji, Jay, MD  Multiple Vitamins-Minerals (MULTIVITAMIN PO) Take 1 tablet by mouth daily.    [provider]  nitroGLYCERIN (NITROSTAT) 0.4 MG SL tablet Place 1 tablet (0.4 mg total) under the tongue every 5 (five) minutes as needed for up to 25 days for chest pain. 04/29/21 02/17/22  Yates DecampGanji, Jay, MD  olmesartan-hydrochlorothiazide (  BENICAR HCT) 20-12.5 MG tablet TAKE 0.5 TABLETS BY MOUTH EVERY MORNING. HOLD ON WEEKENDS 08/04/22   Yates Decamp, MD  omeprazole (PRILOSEC) 20 MG capsule Take 20 mg by mouth daily.    [provider]  PARoxetine (PAXIL) 20 MG tablet TAKE 1 TABLET BY MOUTH EVERY DAY 05/03/22   Glean Salvo, NP  primidone (MYSOLINE) 50 MG tablet TAKE 1 TABLET BY MOUTH  DAILY AND AN ADDITIONAL 1/2 TABLET DAILY AS NEEDED 02/07/22   Glean Salvo, NP  Propylene Glycol (SYSTANE BALANCE OP) Place 1 drop into both eyes daily as needed (dry eyes).    [provider]  saccharomyces boulardii (FLORASTOR) 250 MG capsule Take 250 mg by  mouth daily.     [provider]  simvastatin (ZOCOR) 40 MG tablet Take 1 tablet (40 mg total) by mouth daily at 6 PM. 08/28/22 11/26/22  Yates Decamp, MD    Family History Family History  Problem Relation Age of Onset   Breast cancer Mother        over 58   Heart disease Mother    Heart failure Mother    Atrial fibrillation Mother    Heart disease Father    Heart attack Brother    Heart disease Brother     Social History Social History   Tobacco Use   Smoking status: Never   Smokeless tobacco: Never  Vaping Use   Vaping Use: Never used  Substance Use Topics   Alcohol use: Yes    Comment: Occ on weekends    Drug use: No     Allergies   Atorvastatin calcium and Penicillins   Review of Systems Review of Systems Per HPI  Physical Exam Triage Vital Signs ED Triage Vitals  Enc Vitals Group     BP --      Pulse Rate 10/01/22 1545 (!) 104     Resp 10/01/22 1545 16     Temp 10/01/22 1545 98.6 F (37 C)     Temp Source 10/01/22 1545 Oral     SpO2 10/01/22 1545 98 %     Weight --      Height --      Head Circumference --      Peak Flow --      Pain Score 10/01/22 1543 5     Pain Loc --      Pain Edu? --      Excl. in GC? --    No data found.  Updated Vital Signs Pulse 99   Temp 98.6 F (37 C) (Oral)   Resp 16   SpO2 98%   Visual Acuity Right Eye Distance:   Left Eye Distance:   Bilateral Distance:    Right Eye Near:   Left Eye Near:    Bilateral Near:     Physical Exam Constitutional:      General: She is not in acute distress.    Appearance: Normal appearance. She is not toxic-appearing or diaphoretic.  HENT:     Head: Normocephalic and atraumatic.     Right Ear: Tympanic membrane and ear canal normal.     Left Ear: Tympanic membrane and ear canal normal.     Nose: Congestion present.     Mouth/Throat:     Mouth: Mucous membranes are moist.     Pharynx: Posterior oropharyngeal erythema present. No pharyngeal swelling or  oropharyngeal exudate.     Tonsils: No tonsillar exudate or tonsillar abscesses.  Eyes:  Extraocular Movements: Extraocular movements intact.     Conjunctiva/sclera: Conjunctivae normal.     Pupils: Pupils are equal, round, and reactive to light.  Cardiovascular:     Rate and Rhythm: Normal rate and regular rhythm.     Pulses: Normal pulses.     Heart sounds: Normal heart sounds.  Pulmonary:     Effort: Pulmonary effort is normal. No respiratory distress.     Breath sounds: Normal breath sounds. No stridor. No wheezing, rhonchi or rales.  Abdominal:     General: Abdomen is flat. Bowel sounds are normal.     Palpations: Abdomen is soft.  Musculoskeletal:        General: Normal range of motion.     Cervical back: Normal range of motion.  Skin:    General: Skin is warm and dry.  Neurological:     General: No focal deficit present.     Mental Status: She is alert and oriented to person, place, and time. Mental status is at baseline.  Psychiatric:        Mood and Affect: Mood normal.        Behavior: Behavior normal.      UC Treatments / Results  Labs (all labs ordered are listed, but only abnormal results are displayed) Labs Reviewed  CULTURE, GROUP A STREP [facility])  POCT RAPID STREP A (OFFICE)    EKG   Radiology No results found.  Procedures Procedures (including critical care time)  Medications Ordered in UC Medications - No data to display  Initial Impression / Assessment and Plan / UC Course  I have reviewed the triage vital signs and the nursing notes.  Pertinent labs & imaging results that were available during my care of the patient were reviewed by me and considered in my medical decision making (see chart for details).     Patient's family member tested positive for influenza so given close exposure this is most likely etiology of patient's symptoms.  Patient does appear to have a viral upper respiratory infection.  Do not have resources to do flu  testing here in clinic at this time.  Although, testing would not change treatment given duration of symptoms.  Strep was completed given patient reported significant sore throat and it was negative.  Throat culture is pending.  Patient sent prescriptions to alleviate symptoms.  Discussed supportive care and symptom management with patient.  Patient was mildly tachycardic on initial triage but heart rate on physical exam was normal and recheck was normal.  Patient to ensure adequate fluid hydration and rest.  Discussed return precautions.  Patient verbalized understanding and was agreeable with plan. Final Clinical Impressions(s) / UC Diagnoses   Final diagnoses:  Influenza  Sore throat     Discharge Instructions      Your strep test was negative.  Throat culture is pending.  We will call if it is abnormal.  I suspect that you have influenza given your close exposure.  I have prescribed you a few different medications help alleviate your sore throat and symptoms.  Please ensure that you are drinking adequate fluids and getting adequate rest.  Please follow-up if any symptoms persist or worsen.    ED Prescriptions     Medication Sig Dispense Auth. Provider   guaiFENesin 200 MG tablet Take 1 tablet (200 mg total) by mouth every 4 (four) hours as needed for cough or to loosen phlegm. 30 tablet [place], [place] E, [place]   fluticasone [facility]) 50 MCG/ACT nasal spray Place  1 spray into both nostrils daily. 16 g Dawit Tankard, Rolly Salter E, [place]   phenol (CHLORASEPTIC) 1.4 % LIQD Use as directed 1 spray in the mouth or throat as needed for throat irritation / pain. 118 mL Gustavus Bryant, [place]      PDMP not reviewed this encounter.   Gustavus Bryant, [place] 10/01/22 [phone]

## 2022-10-01 NOTE — ED Triage Notes (Signed)
Pt said x 5 days she has been having nasal congestion, fevers, body aches and dry cough. Her mom was tested positive with flu on Friday.

## 2022-10-01 NOTE — Discharge Instructions (Addendum)
Your strep test was negative.  Throat culture is pending.  We will call if it is abnormal.  I suspect that you have influenza given your close exposure.  I have prescribed you a few different medications help alleviate your sore throat and symptoms.  Please ensure that you are drinking adequate fluids and getting adequate rest.  Please follow-up if any symptoms persist or worsen.

## 2022-10-06 LAB — CULTURE, GROUP A STREP (THRC)

## 2022-10-21 ENCOUNTER — Other Ambulatory Visit: Payer: Self-pay | Admitting: Cardiology

## 2022-10-21 ENCOUNTER — Other Ambulatory Visit: Payer: Self-pay | Admitting: Neurology

## 2022-10-21 DIAGNOSIS — I10 Essential (primary) hypertension: Secondary | ICD-10-CM

## 2022-10-21 DIAGNOSIS — R931 Abnormal findings on diagnostic imaging of heart and coronary circulation: Secondary | ICD-10-CM

## 2022-10-26 NOTE — Telephone Encounter (Signed)
Last appt 02/07/22.

## 2022-11-09 ENCOUNTER — Emergency Department (HOSPITAL_COMMUNITY)
Admission: EM | Admit: 2022-11-09 | Discharge: 2022-11-10 | Disposition: A | Discharge status: Home / Self Care | Payer: 59 | Attending: Emergency Medicine | Admitting: Emergency Medicine

## 2022-11-09 ENCOUNTER — Encounter (HOSPITAL_COMMUNITY): Payer: Self-pay

## 2022-11-09 ENCOUNTER — Ambulatory Visit (HOSPITAL_COMMUNITY)
Admission: EM | Admit: 2022-11-09 | Discharge: 2022-11-09 | Disposition: A | Discharge status: Home / Self Care | Payer: 59 | Attending: Internal Medicine | Admitting: Internal Medicine

## 2022-11-09 ENCOUNTER — Emergency Department (HOSPITAL_COMMUNITY): Payer: 59

## 2022-11-09 ENCOUNTER — Other Ambulatory Visit: Payer: Self-pay

## 2022-11-09 DIAGNOSIS — I10 Essential (primary) hypertension: Secondary | ICD-10-CM | POA: Insufficient documentation

## 2022-11-09 DIAGNOSIS — Z79899 Other long term (current) drug therapy: Secondary | ICD-10-CM | POA: Insufficient documentation

## 2022-11-09 DIAGNOSIS — I2510 Atherosclerotic heart disease of native coronary artery without angina pectoris: Secondary | ICD-10-CM | POA: Diagnosis not present

## 2022-11-09 DIAGNOSIS — R0789 Other chest pain: Secondary | ICD-10-CM | POA: Diagnosis not present

## 2022-11-09 DIAGNOSIS — R42 Dizziness and giddiness: Secondary | ICD-10-CM | POA: Insufficient documentation

## 2022-11-09 DIAGNOSIS — R079 Chest pain, unspecified: Secondary | ICD-10-CM | POA: Diagnosis not present

## 2022-11-09 DIAGNOSIS — D72829 Elevated white blood cell count, unspecified: Secondary | ICD-10-CM | POA: Diagnosis not present

## 2022-11-09 DIAGNOSIS — R0602 Shortness of breath: Secondary | ICD-10-CM | POA: Diagnosis not present

## 2022-11-09 DIAGNOSIS — Z7982 Long term (current) use of aspirin: Secondary | ICD-10-CM | POA: Insufficient documentation

## 2022-11-09 LAB — BASIC METABOLIC PANEL
Anion gap: 8 (ref 5–15)
BUN: 13 mg/dL (ref 6–20)
CO2: 29 mmol/L (ref 22–32)
Calcium: 10 mg/dL (ref 8.9–10.3)
Chloride: 103 mmol/L (ref 98–111)
Creatinine, Ser: 0.91 mg/dL (ref 0.44–1.00)
GFR, Estimated: >60 mL/min (ref >60)
Glucose, Bld: 91 mg/dL (ref 70–99)
Potassium: 4.4 mmol/L (ref 3.5–5.1)
Sodium: 140 mmol/L (ref 135–145)

## 2022-11-09 LAB — URINALYSIS, ROUTINE W REFLEX MICROSCOPIC
Bilirubin Urine: NEGATIVE
Glucose, UA: NEGATIVE mg/dL
Hgb urine dipstick: NEGATIVE
Ketones, ur: NEGATIVE mg/dL
Nitrite: NEGATIVE
Protein, ur: NEGATIVE mg/dL
Specific Gravity, Urine: 1.017 (ref 1.005–1.030)
pH: 5 (ref 5.0–8.0)

## 2022-11-09 LAB — CBC
HCT: 42.9 % (ref 36.0–46.0)
Hemoglobin: 14.5 g/dL (ref 12.0–15.0)
MCH: 32.2 pg (ref 26.0–34.0)
MCHC: 33.8 g/dL (ref 30.0–36.0)
MCV: 95.1 fL (ref 80.0–100.0)
Platelets: 288 10*3/uL (ref 150–400)
RBC: 4.51 MIL/uL (ref 3.87–5.11)
RDW: 12.8 % (ref 11.5–15.5)
WBC: 8.5 10*3/uL (ref 4.0–10.5)
nRBC: 0 % (ref 0.0–0.2)

## 2022-11-09 LAB — I-STAT BETA HCG BLOOD, ED (MC, WL, AP ONLY): I-stat hCG, quantitative: <5 m[IU]/mL (ref <5)

## 2022-11-09 LAB — TROPONIN I (HIGH SENSITIVITY)
Troponin I (High Sensitivity): 3 ng/L (ref <18)
Troponin I (High Sensitivity): 3 ng/L (ref <18)

## 2022-11-09 MED ORDER — LACTATED RINGERS IV BOLUS
1000.0000 mL | Freq: Once | INTRAVENOUS | Status: AC
  Administered 2022-11-09: 1000 mL via INTRAVENOUS

## 2022-11-09 NOTE — ED Notes (Signed)
Patient is being discharged from the Urgent Care and sent to the Emergency Department via personal vehicle. Per Gwenette Greet NP, patient is in need of higher level of care due to Chest pain. Patient is aware and verbalizes understanding of plan of care.  Vitals:   11/09/22 1859  BP: 123/81  Pulse: 61  Resp: 16  Temp: 98 F (36.7 C)  SpO2: 98%

## 2022-11-09 NOTE — ED Provider Notes (Signed)
Jamie Trujillo   CSN: [id number] Arrival date & time: 11/09/22  2013     History  Chief Complaint  Patient presents with   Chest Pain    Jamie Trujillo is a 57 y.o. female who presents with concern for central chest tightness that radiates to the left jaw and shoulder lasting 3 to 5 seconds at a time intermittently throughout the day with associated shortness of breath, lightheadedness that started this afternoon.  Patient follows with Dr. Einar Gip, cardiologist for chest pains in the past, does have coronary artery disease but has not required any intervention up to this point.  States has been under a lot of stress at home, has not been eating or drinking normally, has only urinated once today due to poor p.o. intake.  Per chart review, Mom last saw Dr. Einar Gip in May 2023.  Follows with him for hypertension, hyperlipidemia, also has anxiety, baseline has tremor for which she follows with neurology.  Echo in 2022 with EF 50 to 55%.  Patient without anticoagulation. States she believes her symptoms are related to her anxiety. Pt with Rx for nitro, has not used it today. No hx of syncope.    HPI     Home Medications Prior to Admission medications   Medication Sig Start Date End Date Taking? Authorizing Provider  aspirin (ASPIRIN CHILDRENS) 81 MG chewable tablet Chew 1 tablet (81 mg total) by mouth daily. 04/29/21   Adrian Prows, MD  betamethasone valerate (VALISONE) 0.1 % cream Apply 1 application topically daily as needed (itching on ear). 09/21/14   [provider]  clorazepate (TRANXENE) 7.5 MG tablet TAKE 1 CAPSULE DAILY MONDAY THRU THURSDAY AND IF NEEDED ON OTHER DAYS. 10/26/22   Suzzanne Cloud, NP  ezetimibe (ZETIA) 10 MG tablet TAKE 1 TABLET (10 MG TOTAL) BY MOUTH DAILY AFTER SUPPER. 08/07/22 08/02/23  Adrian Prows, MD  fluocinonide (LIDEX) 0.05 % external solution Apply 2 application topically daily as needed (for itching). 2  drops in each ear as needed for itching    [provider]  fluticasone (FLONASE) 50 MCG/ACT nasal spray Place 1 spray into both nostrils daily. 10/01/22   Teodora Medici, FNP  guaiFENesin 200 MG tablet Take 1 tablet (200 mg total) by mouth every 4 (four) hours as needed for cough or to loosen phlegm. 10/01/22   Teodora Medici, FNP  IBUPROFEN PO Take 600 mg by mouth daily as needed (takes 1-3 x week. (674m tablet) pain).    [provider]  metoprolol tartrate (LOPRESSOR) 50 MG tablet TAKE 1 TABLET BY MOUTH TWICE A DAY 10/23/22   GAdrian Prows MD  Multiple Vitamins-Minerals (MULTIVITAMIN PO) Take 1 tablet by mouth daily.    [provider]  nitroGLYCERIN (NITROSTAT) 0.4 MG SL tablet Place 1 tablet (0.4 mg total) under the tongue every 5 (five) minutes as needed for up to 25 days for chest pain. 04/29/21 02/17/22  GAdrian Prows MD  olmesartan-hydrochlorothiazide (BENICAR HCT) 20-12.5 MG tablet TAKE 0.5 TABLETS BY MOUTH EVERY MORNING. HOLD ON WEEKENDS 08/04/22   GAdrian Prows MD  omeprazole (PRILOSEC) 20 MG capsule Take 20 mg by mouth daily.    [provider]  PARoxetine (PAXIL) 20 MG tablet TAKE 1 TABLET BY MOUTH EVERY DAY 05/03/22   SSuzzanne Cloud NP  phenol (CHLORASEPTIC) 1.4 % LIQD Use as directed 1 spray in the mouth or throat as needed for throat irritation / pain. 10/01/22  [place], [place] E, [place]  primidone (MYSOLINE) 50 MG tablet TAKE 1 TABLET BY MOUTH  DAILY AND AN ADDITIONAL 1/2 TABLET DAILY AS NEEDED 02/07/22   Suzzanne Cloud, NP  Propylene Glycol (SYSTANE BALANCE OP) Place 1 drop into both eyes daily as needed (dry eyes).    [provider]  saccharomyces boulardii (FLORASTOR) 250 MG capsule Take 250 mg by mouth daily.     [provider]  simvastatin (ZOCOR) 40 MG tablet Take 1 tablet (40 mg total) by mouth daily at 6 PM. 08/28/22 11/26/22  Adrian Prows, MD      Allergies    Atorvastatin calcium and Penicillins    Review of Systems   Review of  Systems  Constitutional: Negative.   HENT: Negative.    Respiratory:  Positive for chest tightness and shortness of breath.   Cardiovascular:  Positive for chest pain. Negative for palpitations.  Gastrointestinal: Negative.   Genitourinary: Negative.   Musculoskeletal: Negative.   Skin: Negative.   Psychiatric/Behavioral:  Negative for self-injury. The patient is nervous/anxious.     Physical Exam Updated Vital Signs BP 120/73   Pulse 65   Temp 98.4 F (36.9 C) (Oral)   Resp 16   Ht 5' 2"$  (1.575 m)   Wt 88.9 kg   SpO2 95%   BMI 35.85 kg/m  Physical Exam Vitals and nursing Trujillo reviewed.  Constitutional:      Appearance: She is not ill-appearing or toxic-appearing.  HENT:     Head: Normocephalic and atraumatic.     Mouth/Throat:     Mouth: Mucous membranes are moist.     Pharynx: No oropharyngeal exudate or posterior oropharyngeal erythema.  Eyes:     General:        Right eye: No discharge.        Left eye: No discharge.     Extraocular Movements: Extraocular movements intact.     Conjunctiva/sclera: Conjunctivae normal.     Pupils: Pupils are equal, round, and reactive to light.  Cardiovascular:     Rate and Rhythm: Normal rate and regular rhythm.     Pulses: Normal pulses.     Heart sounds: Normal heart sounds. No murmur heard. Pulmonary:     Effort: Pulmonary effort is normal. No respiratory distress.     Breath sounds: Normal breath sounds. No wheezing or rales.  Abdominal:     General: Bowel sounds are normal. There is no distension.     Tenderness: There is no abdominal tenderness.  Musculoskeletal:        General: No deformity.     Cervical back: Neck supple.     Right lower leg: No tenderness. No edema.     Left lower leg: No tenderness. No edema.  Skin:    General: Skin is warm and dry.     Capillary Refill: Capillary refill takes less than 2 seconds.  Neurological:     General: No focal deficit present.     Mental Status: She is alert and oriented  to person, place, and time. Mental status is at baseline.  Psychiatric:        Mood and Affect: Mood normal.     ED Results / Procedures / Treatments   Labs (all labs ordered are listed, but only abnormal results are displayed) Labs Reviewed  URINALYSIS, ROUTINE W REFLEX MICROSCOPIC - Abnormal; Notable for the following components:      Result Value   APPearance HAZY (*)    Leukocytes,Ua TRACE (*)  Bacteria, UA RARE (*)    All other components within normal limits  BASIC METABOLIC PANEL  CBC  I-STAT BETA HCG BLOOD, ED ([facility], [facility], [facility] ONLY)  TROPONIN I (HIGH SENSITIVITY)  TROPONIN I (HIGH SENSITIVITY)    EKG None  Radiology DG Chest 2 View  Result Date: 11/09/2022 CLINICAL DATA:  chest pain EXAM: CHEST - 2 VIEW COMPARISON:  Chest XR, 06/09/2021.  CT chest, 06/25/2019. FINDINGS: Cardiomediastinal silhouette is within normal limits. Lungs are well inflated. No focal consolidation or mass. No pleural effusion or pneumothorax. Cholecystectomy. No acute displaced fracture. IMPRESSION: No active cardiopulmonary disease. Electronically Signed   By: Michaelle Birks M.D.   On: 11/09/2022 21:18    Procedures Procedures    Medications Ordered in ED Medications  metoprolol tartrate (LOPRESSOR) tablet 50 mg (has no administration in time range)  lactated ringers bolus 1,000 mL (1,000 mLs Intravenous New Bag/Given 11/09/22 2345)    ED Course/ Medical Decision Making/ A&P                             Medical Decision Making 57 year old female with history of coronary artery disease who presents with concern for anxiety, intermittent chest pain, associated shortness of breath and lightheadedness today.  Hypertension on intake, vital signs otherwise normal.  Cardiopulmonary exam is normal, abdominal exam is benign.  Patient does appear anxious.  DDx includes but is not limited to  ACS, PE, pleural effusion, anxiety, pneumonia, pneumothorax, hypovolemia.  Amount and/or Complexity of Data  Reviewed Labs: ordered.    Details: CBC with leukocytosis or anemia, BMP unremarkable.  Patient is not pregnant, troponin negative x 2, flat.  UA unremarkable.   Radiology: ordered.    Details:  Chest x-ray shows provider negative for acute cardiopulmonary disease. ECG/medicine tests:     Details: EKG with normal sinus rhythm, no STEMI, no interval changes.   Risk Prescription drug management.   Clinical picture inconsistent with ACS.  While the exact etiology of her symptoms remains unclear suspect contribution of her anxiety, as well as poor oral hydration practices at home.  Clinical concern for emergent underlying etiology that would warrant further ED workup or inpatient management is exceedingly low.  Recommend close outpatient follow-up with cardiology, increased oral hydration at home.  Jamie Trujillo voiced understanding of her medical evaluation and treatment plan. Each of their questions was answered to their expressed satisfaction.  Return precautions were given.  Patient is well-appearing, stable, and was discharged in good condition.  This chart was dictated using voice recognition software, Dragon. Despite the best efforts of this provider to proofread and correct errors, errors may still occur which can change documentation meaning.  Final Clinical Impression(s) / ED Diagnoses Final diagnoses:  None    Rx / DC Orders ED Discharge Orders     None         Emeline Darling, PA-C 11/10/22 0124    Orpah Greek, MD 11/10/22 [phone]

## 2022-11-09 NOTE — Discharge Instructions (Addendum)
I am very concerned that your symptoms may be related to your heart. Your EKG looks good, however I am still concerned that you may be experiencing what we call coronary spasm. I would like for you to go to the nearest emergency department for further evaluation and workup of your chest pain tonight.

## 2022-11-09 NOTE — ED Triage Notes (Signed)
Patient reports central chest pain with SOB , fatigue and dizziness onset this afternoon , no emesis or diaphoresis .

## 2022-11-09 NOTE — ED Triage Notes (Signed)
Chief Complaint: SOB, Dizziness, Fatigue, Intermittent Chest Pain, Cough. Diagnosed with the flu 09/29/22. States starting today feeling pout of it, dizzy, and tired. On and off sharp chest pain.  Onset: Worse today.   Prescriptions or OTC medications tried: Yes- ibuprofen    with mild relief

## 2022-11-10 MED ORDER — METOPROLOL TARTRATE 25 MG PO TABS
50.0000 mg | ORAL_TABLET | Freq: Once | ORAL | Status: AC
  Administered 2022-11-10: 50 mg via ORAL
  Filled 2022-11-10: qty 2

## 2022-11-10 NOTE — Discharge Instructions (Addendum)
You were seen in the ER today for your Chest wall pain in your lightheadedness.  Your vital signs are very reassuring at this time.  Your blood work was also reassuring as was your chest x-ray and EKG.  While the exact cause of your symptoms remains unclear there does not appear to be any emergent problem at this time.  Please call Dr. Irven Shelling office today for close outpatient follow-up.  Increase your hydration and rest plenty over the weekend.  Follow-up with your primary care doctor as needed.  Return to the ER with any severe symptoms.

## 2022-11-19 NOTE — ED Provider Notes (Signed)
[facility]    CSN: [id number] Arrival date & time: 11/09/22  1617      History   Chief Complaint Chief Complaint  Patient presents with   Cough   Shortness of Breath   Chest Pain    HPI Jamie Trujillo is a 57 y.o. female.   Patient presents to urgent care for evaluation of shortness of breath and left-sided chest discomfort that started today. Chest discomfort is to the central/left sternal region of the chest wall and lasts approximately 3-5 seconds when it happens. Resolves without intervention. Chest pain is intermittent and comes and goes without known triggering factor. 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 She states the chest pain sometimes goes into the left jaw/left neck. Denies nausea, vomiting, dizziness, recent falls/injuries to the chest, and heart palpitations. She does report intermittent shortness of breath associated with the chest discomfort and reports intermittent dyspnea on exertion over the last few months. History of HLD, HTN, and anxiety. She has not attempted use of any otc medicine to help with pain. Denies GERD-like symptoms. No recent viral illnesses or viral URI symptoms. No fever/chills or cough. No leg swelling or orthopnea.    Cough Associated symptoms: chest pain and shortness of breath   Shortness of Breath Associated symptoms: chest pain and cough   Chest Pain Associated symptoms: cough and shortness of breath     Past Medical History:  Diagnosis Date   Anxiety    Depression    Essential tremor    Heart murmur 10/03/2007   Hyperlipidemia    Hypertension     Patient Active Problem List   Diagnosis Date Noted   Chronic pain 11/15/2021   Neck pain 11/15/2021   Paresthesia 11/15/2021   Precordial pain    Hypertension 03/22/2021   Elevated triglycerides with high cholesterol 03/22/2021   Gastroesophageal reflux disease  05/03/2019   Anxiety 07/30/2017   Essential tremor 03/30/2014   Neck muscle strain 03/30/2014    Past Surgical History:  Procedure Laterality Date   Samson  10/02/2001   Removed     OB History   No obstetric history on file.      Home Medications    Prior to Admission medications   Medication Sig Start Date End Date Taking? Authorizing Provider  aspirin (ASPIRIN CHILDRENS) 81 MG chewable tablet Chew 1 tablet (81 mg total) by mouth daily. 04/29/21  Yes Adrian Prows, MD  clorazepate (TRANXENE) 7.5 MG tablet TAKE 1 CAPSULE DAILY MONDAY THRU THURSDAY AND IF NEEDED ON OTHER DAYS. 10/26/22  Yes Suzzanne Cloud, NP  ezetimibe (ZETIA) 10 MG tablet TAKE 1 TABLET (10 MG TOTAL) BY MOUTH DAILY AFTER SUPPER. 08/07/22 08/02/23 Yes Adrian Prows, MD  fluocinonide (LIDEX) 0.05 % external solution Apply 2 application topically daily as needed (for itching). 2 drops in each ear as needed for itching   Yes [provider]  fluticasone (FLONASE) 50 MCG/ACT nasal spray Place 1 spray into both nostrils daily. 10/01/22  Yes Mound, Hildred Alamin E, FNP  guaiFENesin 200 MG tablet Take 1 tablet (200 mg total) by mouth every 4 (four) hours as needed for cough or to loosen phlegm. 10/01/22  Yes Mound, Hildred Alamin E, FNP  IBUPROFEN PO Take 600 mg by mouth daily as needed (takes 1-3 x week. ('600mg'$  tablet) pain).   Yes  [provider]  metoprolol tartrate (LOPRESSOR) 50 MG tablet TAKE 1 TABLET BY MOUTH TWICE A DAY 10/23/22  Yes Adrian Prows, MD  Multiple Vitamins-Minerals (MULTIVITAMIN PO) Take 1 tablet by mouth daily.   Yes [provider]  olmesartan-hydrochlorothiazide (BENICAR HCT) 20-12.5 MG tablet TAKE 0.5 TABLETS BY MOUTH EVERY MORNING. HOLD ON WEEKENDS 08/04/22  Yes Adrian Prows, MD  omeprazole (PRILOSEC) 20 MG capsule Take 20 mg by mouth daily.   Yes [provider]  PARoxetine (PAXIL) 20 MG tablet TAKE 1 TABLET BY MOUTH EVERY DAY 05/03/22  Yes Suzzanne Cloud, NP   primidone (MYSOLINE) 50 MG tablet TAKE 1 TABLET BY MOUTH  DAILY AND AN ADDITIONAL 1/2 TABLET DAILY AS NEEDED 02/07/22  Yes Suzzanne Cloud, NP  simvastatin (ZOCOR) 40 MG tablet Take 1 tablet (40 mg total) by mouth daily at 6 PM. 08/28/22 11/26/22 Yes Adrian Prows, MD  betamethasone valerate (VALISONE) 0.1 % cream Apply 1 application topically daily as needed (itching on ear). 09/21/14   [provider]  nitroGLYCERIN (NITROSTAT) 0.4 MG SL tablet Place 1 tablet (0.4 mg total) under the tongue every 5 (five) minutes as needed for up to 25 days for chest pain. 04/29/21 02/17/22  Adrian Prows, MD  phenol (CHLORASEPTIC) 1.4 % LIQD Use as directed 1 spray in the mouth or throat as needed for throat irritation / pain. 10/01/22   Teodora Medici, FNP  Propylene Glycol (SYSTANE BALANCE OP) Place 1 drop into both eyes daily as needed (dry eyes).    [provider]  saccharomyces boulardii (FLORASTOR) 250 MG capsule Take 250 mg by mouth daily.     [provider]    Family History Family History  Problem Relation Age of Onset   Breast cancer Mother        over 64   Heart disease Mother    Heart failure Mother    Atrial fibrillation Mother    Heart disease Father    Heart attack Brother    Heart disease Brother     Social History Social History   Tobacco Use   Smoking status: Never   Smokeless tobacco: Never  Vaping Use   Vaping Use: Never used  Substance Use Topics   Alcohol use: Yes    Comment: Occ on weekends    Drug use: No     Allergies   Atorvastatin calcium and Penicillins   Review of Systems Review of Systems  Respiratory:  Positive for cough and shortness of breath.   Cardiovascular:  Positive for chest pain.  Per HPI   Physical Exam Triage Vital Signs ED Triage Vitals  Enc Vitals Group     BP 11/09/22 1859 123/81     Pulse Rate 11/09/22 1859 61     Resp 11/09/22 1859 16     Temp 11/09/22 1859 98 F (36.7 C)     Temp Source 11/09/22 1859 Oral      SpO2 11/09/22 1859 98 %     Weight 11/09/22 1858 196 lb (88.9 kg)     Height 11/09/22 1858 5' 2.5" (1.588 m)     Head Circumference --      Peak Flow --      Pain Score 11/09/22 1854 1     Pain Loc --      Pain Edu? --      Excl. in [place]? --    No data found.  Updated Vital Signs BP 123/81 (BP Location: Right Arm)  Pulse 61   Temp 98 F (36.7 C) (Oral)   Resp 16   Ht 5' 2.5" (1.588 m)   Wt 196 lb (88.9 kg)   SpO2 98%   BMI 35.28 kg/m   Visual Acuity Right Eye Distance:   Left Eye Distance:   Bilateral Distance:    Right Eye Near:   Left Eye Near:    Bilateral Near:     Physical Exam Vitals and nursing note reviewed.  Constitutional:      Appearance: She is not ill-appearing or toxic-appearing.  HENT:     Head: Normocephalic and atraumatic.     Right Ear: Hearing, tympanic membrane, ear canal and external ear normal.     Left Ear: Hearing, tympanic membrane, ear canal and external ear normal.     Nose: Nose normal.     Mouth/Throat:     Lips: Pink.     Mouth: Mucous membranes are moist. No injury.     Tongue: No lesions. Tongue does not deviate from midline.     Palate: No mass and lesions.     Pharynx: Oropharynx is clear. Uvula midline. No pharyngeal swelling, oropharyngeal exudate, posterior oropharyngeal erythema or uvula swelling.     Tonsils: No tonsillar exudate or tonsillar abscesses.  Eyes:     General: Lids are normal. Vision grossly intact. Gaze aligned appropriately.     Extraocular Movements: Extraocular movements intact.     Conjunctiva/sclera: Conjunctivae normal.  Cardiovascular:     Rate and Rhythm: Normal rate and regular rhythm.     Heart sounds: Normal heart sounds, S1 normal and S2 normal.  Pulmonary:     Effort: Pulmonary effort is normal. No respiratory distress.     Breath sounds: Normal breath sounds and air entry.  Abdominal:     General: Bowel sounds are normal.     Palpations: Abdomen is soft.     Tenderness: There is no  abdominal tenderness. There is no right CVA tenderness, left CVA tenderness or guarding.  Musculoskeletal:     Cervical back: Neck supple.  Skin:    General: Skin is warm and dry.     Capillary Refill: Capillary refill takes less than 2 seconds.     Findings: No rash.  Neurological:     General: No focal deficit present.     Mental Status: She is alert and oriented to person, place, and time. Mental status is at baseline.     Cranial Nerves: No dysarthria or facial asymmetry.  Psychiatric:        Mood and Affect: Mood normal.        Speech: Speech normal.        Behavior: Behavior normal.        Thought Content: Thought content normal.        Judgment: Judgment normal.      UC Treatments / Results  Labs (all labs ordered are listed, but only abnormal results are displayed) Labs Reviewed - No data to display  EKG   Radiology No results found.  Procedures Procedures (including critical care time)  Medications Ordered in UC Medications - No data to display  Initial Impression / Assessment and Plan / UC Course  I have reviewed the triage vital signs and the nursing notes.  Pertinent labs & imaging results that were available during my care of the patient were reviewed by me and considered in my medical decision making (see chart for details).   1. Chest pain History and physical exam  are concerning for possible ACS, unable to rule this out at urgent care due to lack of resources. EKG shows stable findings without ST/T wave changes. Vitals are hemodynamically stable. Recommend workup in the ED with troponin levels to rule out cardiac etiology as pain is not reproducible with palpation of the chest wall and patient has never had cardiac workup in the past for similar symptoms. She is very anxious in clinic, however she is stable to go to the ER by personal vehicle. Discussed risks of deferring ED visit to which patient expresses understanding. Patient discharged from urgent care  in stable condition to [facility] ED.   Final Clinical Impressions(s) / UC Diagnoses   Final diagnoses:  Chest pain, unspecified type     Discharge Instructions      I am very concerned that your symptoms may be related to your heart. Your EKG looks good, however I am still concerned that you may be experiencing what we call coronary spasm. I would like for you to go to the nearest emergency department for further evaluation and workup of your chest pain tonight.   ED Prescriptions   None    PDMP not reviewed this encounter.   Talbot Grumbling, [place] 11/29/22 [phone]

## 2022-11-23 ENCOUNTER — Other Ambulatory Visit: Payer: Self-pay | Admitting: Cardiology

## 2022-11-23 DIAGNOSIS — E7800 Pure hypercholesterolemia, unspecified: Secondary | ICD-10-CM

## 2022-11-23 DIAGNOSIS — R931 Abnormal findings on diagnostic imaging of heart and coronary circulation: Secondary | ICD-10-CM

## 2022-12-10 ENCOUNTER — Encounter (HOSPITAL_COMMUNITY): Payer: Self-pay

## 2022-12-10 ENCOUNTER — Emergency Department (HOSPITAL_COMMUNITY)
Admission: EM | Admit: 2022-12-10 | Discharge: 2022-12-10 | Disposition: A | Discharge status: Home / Self Care | Payer: 59 | Attending: Emergency Medicine | Admitting: Emergency Medicine

## 2022-12-10 ENCOUNTER — Emergency Department (HOSPITAL_COMMUNITY): Payer: 59

## 2022-12-10 ENCOUNTER — Other Ambulatory Visit: Payer: Self-pay

## 2022-12-10 DIAGNOSIS — R6884 Jaw pain: Secondary | ICD-10-CM | POA: Insufficient documentation

## 2022-12-10 DIAGNOSIS — R079 Chest pain, unspecified: Secondary | ICD-10-CM | POA: Diagnosis present

## 2022-12-10 LAB — BASIC METABOLIC PANEL
Anion gap: 5 (ref 5–15)
BUN: 18 mg/dL (ref 6–20)
CO2: 28 mmol/L (ref 22–32)
Calcium: 9.9 mg/dL (ref 8.9–10.3)
Chloride: 107 mmol/L (ref 98–111)
Creatinine, Ser: 0.84 mg/dL (ref 0.44–1.00)
GFR, Estimated: >60 mL/min (ref >60)
Glucose, Bld: 75 mg/dL (ref 70–99)
Potassium: 4 mmol/L (ref 3.5–5.1)
Sodium: 140 mmol/L (ref 135–145)

## 2022-12-10 LAB — CBC
HCT: 44.5 % (ref 36.0–46.0)
Hemoglobin: 14.7 g/dL (ref 12.0–15.0)
MCH: 31.3 pg (ref 26.0–34.0)
MCHC: 33 g/dL (ref 30.0–36.0)
MCV: 94.9 fL (ref 80.0–100.0)
Platelets: 260 10*3/uL (ref 150–400)
RBC: 4.69 MIL/uL (ref 3.87–5.11)
RDW: 12.8 % (ref 11.5–15.5)
WBC: 7.8 10*3/uL (ref 4.0–10.5)
nRBC: 0 % (ref 0.0–0.2)

## 2022-12-10 LAB — TROPONIN I (HIGH SENSITIVITY)
Troponin I (High Sensitivity): 3 ng/L (ref <18)
Troponin I (High Sensitivity): 3 ng/L (ref <18)

## 2022-12-10 MED ORDER — KETOROLAC TROMETHAMINE 15 MG/ML IJ SOLN
15.0000 mg | Freq: Once | INTRAMUSCULAR | Status: AC
  Administered 2022-12-10: 15 mg via INTRAVENOUS
  Filled 2022-12-10: qty 1

## 2022-12-10 MED ORDER — FAMOTIDINE 20 MG PO TABS
20.0000 mg | ORAL_TABLET | Freq: Once | ORAL | Status: AC
  Administered 2022-12-10: 20 mg via ORAL
  Filled 2022-12-10: qty 1

## 2022-12-10 NOTE — ED Triage Notes (Signed)
Pt reports aprox 1 hour ago, was having a stressful conversation with husband when a sudden onset of pain underneath left breast, she then reports pain then went to left shoulder and down her arm. Reports took 1 nitro with relief. Currently not having pain . No s/s of acute distress noted in triage

## 2022-12-10 NOTE — ED Provider Notes (Signed)
[facility] Provider Note   CSN: [id number] Arrival date & time: 12/10/22  1551     History Chief Complaint  Patient presents with   Chest Pain   Arm Pain    left    HPI Jamie Trujillo is a 57 y.o. female presenting for chief complaint of chest and jaw pain.  States that she is a 57 year old female with a extensive medical history.  Denies fevers chills nausea vomiting syncope shortness of breath.  Otherwise ambulatory tolerating p.o. intake. No known sick contacts.   Patient's recorded medical, surgical, social, medication list and allergies were reviewed in the Snapshot window as part of the initial history.   Review of Systems   Review of Systems  Constitutional:  Negative for chills and fever.  HENT:  Negative for ear pain and sore throat.   Eyes:  Negative for pain and visual disturbance.  Respiratory:  Positive for chest tightness. Negative for cough and shortness of breath.   Cardiovascular:  Positive for chest pain. Negative for palpitations.  Gastrointestinal:  Negative for abdominal pain and vomiting.  Genitourinary:  Negative for dysuria and hematuria.  Musculoskeletal:  Negative for arthralgias and back pain.  Skin:  Negative for color change and rash.  Neurological:  Negative for seizures and syncope.  All other systems reviewed and are negative.   Physical Exam Updated Vital Signs BP 126/81   Pulse 82   Temp 98.1 F (36.7 C) (Oral)   Resp 12   Ht '5\' 2"'$  (1.575 m)   Wt 88.5 kg   SpO2 96%   BMI 35.67 kg/m  Physical Exam Vitals and nursing note reviewed.  Constitutional:      General: She is not in acute distress.    Appearance: She is well-developed.  HENT:     Head: Normocephalic and atraumatic.  Eyes:     Conjunctiva/sclera: Conjunctivae normal.  Cardiovascular:     Rate and Rhythm: Normal rate and regular rhythm.     Heart sounds: No murmur heard. Pulmonary:     Effort: Pulmonary effort is normal.  No respiratory distress.     Breath sounds: Normal breath sounds.  Abdominal:     Palpations: Abdomen is soft.     Tenderness: There is no abdominal tenderness.  Musculoskeletal:        General: No swelling.     Cervical back: Neck supple.  Skin:    General: Skin is warm and dry.     Capillary Refill: Capillary refill takes less than 2 seconds.  Neurological:     Mental Status: She is alert.  Psychiatric:        Mood and Affect: Mood normal.      ED Course/ Medical Decision Making/ A&P    Procedures Procedures   Medications Ordered in ED Medications  ketorolac (TORADOL) 15 MG/ML injection 15 mg (15 mg Intravenous Given 12/10/22 1833)  famotidine (PEPCID) tablet 20 mg (20 mg Oral Given 12/10/22 1835)   Medical Decision Making: Jamie Trujillo is a 57 y.o. female who presented to the ED today with chest pain, detailed above.     Patient's presentation is complicated by their history of known CAD.  Patient placed on continuous vitals and telemetry monitoring while in ED which was reviewed periodically.  Complete initial physical exam performed, notably the patient was stable in no acute distress.  Asymptomatic since hours prior to arrival.   Reviewed and confirmed nursing documentation for past medical history,  family history, social history.    Initial Assessment: With the patient's presentation of left-sided chest pain, most likely diagnosis is musculoskeletal chest pain versus GERD, although ACS remains on the differential. Other diagnoses were considered including (but not limited to) pulmonary embolism, community-acquired pneumonia, aortic dissection, pneumothorax, underlying bony abnormality, anemia. These are considered less likely due to history of present illness and physical exam findings.    In particular, concerning pulmonary embolism: they deny malignancy, recent surgery, history of DVT, or calf tenderness leading to a low risk Wells score. Aortic Dissection also  reconsidered but seems less likely based on the location, quality, onset, and severity of symptoms in this case. Patient has a lack of serious comorbidities for this condition including a lack of HTN or Smoking. Patient also has a lack of underlying history of AD or TAA.  This is most consistent with an acute life/limb threatening illness complicated by underlying chronic conditions.   Initial Plan: Evaluate for ACS with delta troponin and EKG evaluated as below  Evaluate for dissection, bony abnormality, or pneumonia with chest x-ray and screening laboratory evaluation including CBC, BMP  Further evaluation for pulmonary embolism not indicated at this time based on patient's Wells score.  Further evaluation for Thoracic Aortic Dissection not indicated at this time based on patient's clinical history and PE findings.   Initial Study Results: EKG was reviewed independently. Rate, rhythm, axis, intervals all examined and without medically relevant abnormality. ST segments without concerns for elevations.    Laboratory  Delta troponin demonstrated no acute abnormality  CBC and BMP without obvious metabolic or inflammatory abnormalities requiring further evaluation   Radiology  DG Chest Portable 1 View  Result Date: 12/10/2022 CLINICAL DATA:  Chest pain EXAM: PORTABLE CHEST 1 VIEW COMPARISON:  11/09/2022 FINDINGS: The cardiomediastinal silhouette is unremarkable. There is no evidence of focal airspace disease, pulmonary edema, suspicious pulmonary nodule/mass, pleural effusion, or pneumothorax. No acute bony abnormalities are identified. IMPRESSION: No active disease. Electronically Signed   By: Margarette Canada M.D.   On: 12/10/2022 17:08    Final Assessment and Plan: On reassessment, symptoms remain resolved.  Given patient's history present illness and physical exam findings, no acute indication for further intervention.  No evidence of obstructive myocardial infarction on EKG or serial troponin,  x-ray with no focal pathology.  Fortunately, symptoms have remained resolved throughout the duration of her 4 and half hour observation. Given resolution of symptoms, reassuring presentation at this time, patient stable for outpatient care management.  She does have a history of CAD and therefore warrants immediate follow-up with cardiology in the outpatient setting.  Strict return precautions regarding interval development of worsening symptoms and possible clinical overlap with myocardial infarction reinforced and patient expressed understanding.  Disposition:  I have considered need for hospitalization, however, considering all of the above, I believe this patient is stable for discharge at this time.  Patient/family educated about specific return precautions for given chief complaint and symptoms.  Patient/family educated about follow-up with PCP.     Patient/family expressed understanding of return precautions and need for follow-up. Patient spoken to regarding all imaging and laboratory results and appropriate follow up for these results. All education provided in verbal form with additional information in written form. Time was allowed for answering of patient questions. Patient discharged.    Emergency Department Medication Summary:   Medications  ketorolac (TORADOL) 15 MG/ML injection 15 mg (15 mg Intravenous Given 12/10/22 1833)  famotidine (PEPCID) tablet 20 mg (20 mg  Oral Given 12/10/22 1835)                  Clinical Impression:  1. Chest pain, unspecified type      Discharge   Final Clinical Impression(s) / ED Diagnoses Final diagnoses:  Chest pain, unspecified type    Rx / DC Orders ED Discharge Orders     None         Tretha Sciara, MD 12/10/22 2032

## 2022-12-11 ENCOUNTER — Telehealth: Payer: Self-pay

## 2022-12-11 ENCOUNTER — Encounter: Payer: Self-pay | Admitting: Cardiology

## 2022-12-11 ENCOUNTER — Ambulatory Visit: Payer: 59 | Admitting: Cardiology

## 2022-12-11 VITALS — BP 157/88 | HR 81 | Ht 62.0 in | Wt 199.0 lb

## 2022-12-11 DIAGNOSIS — I25118 Atherosclerotic heart disease of native coronary artery with other forms of angina pectoris: Secondary | ICD-10-CM

## 2022-12-11 DIAGNOSIS — E7800 Pure hypercholesterolemia, unspecified: Secondary | ICD-10-CM

## 2022-12-11 DIAGNOSIS — I10 Essential (primary) hypertension: Secondary | ICD-10-CM

## 2022-12-11 DIAGNOSIS — R931 Abnormal findings on diagnostic imaging of heart and coronary circulation: Secondary | ICD-10-CM

## 2022-12-11 MED ORDER — ASPIRIN 81 MG PO CHEW: 81.0000 mg | CHEWABLE_TABLET | ORAL | Status: DC

## 2022-12-11 MED ORDER — OLMESARTAN MEDOXOMIL-HCTZ 20-12.5 MG PO TABS: 1.0000 | ORAL_TABLET | ORAL | 3 refills | Status: DC

## 2022-12-11 NOTE — Telephone Encounter (Signed)
Patient went to hospital to be seen for severe chest pain. Pain was relieved somewhat with nitrostat. Patient says that she needs to be seen today. We will work her in with Dr. Terri Skains or Dr. Einar Gip.

## 2022-12-11 NOTE — Progress Notes (Signed)
Primary Physician/Referring:  Deland Pretty, MD  Patient ID: Jamie Trujillo, female    DOB: 09/30/1966, 57 y.o.   MRN: [id number]  Chief Complaint  Patient presents with   Elevated coronary artery calcium score 06/24/2021: Total [place]   Follow-up    Chest Pain   HPI:    Jamie Trujillo  is a 57 y.o. 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    Patient was recently seen in the emergency room for angina pectoris precipitated by extreme stress.  Fortunately cardiac markers were negative and EKG was normal.  She has not had any recurrence of chest pain since then.  Past Medical History:  Diagnosis Date   Anxiety    Depression    Essential tremor    Heart murmur 10/03/2007   Hyperlipidemia    Hypertension    Past Surgical History:  Procedure Laterality Date   [place] SURGERY  10/02/2001   Removed    Family History  Problem Relation Age of Onset   Breast cancer Mother        over 88   Heart disease Mother    Heart failure Mother    Atrial fibrillation Mother    Heart disease Father    Heart attack Brother    Heart disease Brother     Social History   Tobacco Use   Smoking status: Never   Smokeless tobacco: Never  Substance Use Topics   Alcohol use: Yes    Comment: Occ on weekends    Marital Status: Married  ROS  Review of Systems  Cardiovascular:  Positive for chest pain. Negative for dyspnea on exertion and leg swelling.   Objective  Blood pressure (!) 157/88, pulse 81, height '5\' 2"'$  (1.575 m), weight 199 lb (90.3 kg), SpO2 97 %. Body mass index is 36.4 kg/m.     12/11/2022    4:40 PM 12/11/2022    4:36 PM 12/10/2022    8:30 PM  Vitals with BMI  Height  '5\' 2"'$     Weight  199 lbs   BMI  [id number]   Systolic [id number]  Diastolic 88 95 89  Pulse  81 84    Physical Exam Constitutional:      Appearance: She is obese.  Neck:     Vascular: No carotid bruit or JVD.  Cardiovascular:     Rate and Rhythm: Normal rate and regular rhythm.     Pulses: Intact distal pulses.     Heart sounds: Normal heart sounds. No murmur heard.    No gallop.  Pulmonary:     Effort: Pulmonary effort is normal.     Breath sounds: Normal breath sounds.  Abdominal:     General: Bowel sounds are normal.     Palpations: Abdomen is soft.  Musculoskeletal:     Right lower leg: No edema.     Left lower leg: No edema.      Laboratory examination:   Lab Results  Component Value Date   NA 140 12/10/2022   K 4.0 12/10/2022   CO2 28 12/10/2022   GLUCOSE 75 12/10/2022   BUN 18 12/10/2022   CREATININE 0.84 12/10/2022   CALCIUM 9.9 12/10/2022   EGFR 68 06/16/2021   GFRNONAA >60  12/10/2022    Lab Results  Component Value Date   WBC 7.8 12/10/2022   HGB 14.7 12/10/2022   HCT 44.5 12/10/2022   MCV 94.9 12/10/2022   PLT 260 12/10/2022    External labs:   Labs 04/10/2022:  Total cholesterol 229, triglycerides 238, HDL 51, LDL 130.  TSH 2.70, normal.Vitamin D 58.  BUN 20, creatinine 0.88, serum glucose 106 mg, potassium 4.1 EGFR 73 mL.  Hb 14.3/HCT 42.7, platelets 249, normal indicis.  Labs 09/13/2021:  A1c 5.8%.  Total cholesterol 152, triglycerides 95, HDL 63, LDL 70.  Non-HDL cholesterol 89.  Labs 03/24/2021: Total cholesterol 236, triglycerides 121, HDL 58, LDL 154. Non-HDL cholesterol 178.  Medications and allergies   Allergies  Allergen Reactions   Atorvastatin Calcium Other (See Comments)    Myalgia   Penicillins    Penicillin G Rash    Medication list after today's encounter    Current Outpatient Medications:    betamethasone valerate (VALISONE) 0.1 % cream, Apply 1 application topically daily as needed (itching on ear)., Disp: , Rfl:     clorazepate (TRANXENE) 7.5 MG tablet, TAKE 1 CAPSULE DAILY MONDAY THRU THURSDAY AND IF NEEDED ON OTHER DAYS., Disp: 90 tablet, Rfl: 0   ezetimibe (ZETIA) 10 MG tablet, TAKE 1 TABLET (10 MG TOTAL) BY MOUTH DAILY AFTER SUPPER., Disp: 90 tablet, Rfl: 3   fluocinonide (LIDEX) 0.05 % external solution, Apply 2 application topically daily as needed (for itching). 2 drops in each ear as needed for itching, Disp: , Rfl:    IBUPROFEN PO, Take 600 mg by mouth daily as needed (takes 1-3 x week. ('600mg'$  tablet) pain)., Disp: , Rfl:    metoprolol tartrate (LOPRESSOR) 50 MG tablet, TAKE 1 TABLET BY MOUTH TWICE A DAY, Disp: 180 tablet, Rfl: 3   Multiple Vitamins-Minerals (MULTIVITAMIN PO), Take 1 tablet by mouth daily., Disp: , Rfl:    nitroGLYCERIN (NITROSTAT) 0.4 MG SL tablet, Place 1 tablet (0.4 mg total) under the tongue every 5 (five) minutes as needed for up to 25 days for chest pain., Disp: 25 tablet, Rfl: 3   omeprazole (PRILOSEC) 20 MG capsule, Take 20 mg by mouth daily., Disp: , Rfl:    PARoxetine (PAXIL) 20 MG tablet, TAKE 1 TABLET BY MOUTH EVERY DAY, Disp: 90 tablet, Rfl: 3   primidone (MYSOLINE) 50 MG tablet, TAKE 1 TABLET BY MOUTH  DAILY AND AN ADDITIONAL 1/2 TABLET DAILY AS NEEDED, Disp: 135 tablet, Rfl: 4   simvastatin (ZOCOR) 40 MG tablet, TAKE 1 TABLET BY MOUTH DAILY AT 6 PM., Disp: 90 tablet, Rfl: 0   aspirin (ASPIRIN CHILDRENS) 81 MG chewable tablet, Chew 1 tablet (81 mg total) by mouth every other day., Disp: , Rfl:    olmesartan-hydrochlorothiazide (BENICAR HCT) 20-12.5 MG tablet, Take 1 tablet by mouth every morning. Hold on weekends, Disp: 90 tablet, Rfl: 3   Radiology:   CT angiogram of the chest and abdomen 06/09/2021: 1. Normal contour and caliber of the thoracic and abdominal aorta. No evidence of aneurysm, dissection, or other acute aortic pathology. Mild, scattered aortic atherosclerosis. 2. Coronary artery disease. 3. Hepatic steatosis. 4. Status post cholecystectomy. 5.  Descending and sigmoid diverticulosis without evidence of acute diverticulitis.  Cardiac Studies:   PCV ECHOCARDIOGRAM COMPLETE 05/13/2021 Left ventricle cavity is normal in size. Mild concentric hypertrophy of the left ventricle. Normal global wall motion. Normal LV systolic function with visual EF 50-55%. Normal diastolic filling pattern. Mild (Grade I) mitral regurgitation. Unlike study in 2015, PFO not  appreciated.    PCV MYOCARDIAL PERFUSION WO LEXISCAN 05/23/2021 Exercise nuclear stress test was performed using Bruce protocol. Patient reached 6.2 METS, and 88% of age predicted maximum heart rate. Exercise capacity was low. No chest pain reported. Heart rate and hemodynamic response were normal. Stress EKG revealed no ischemic changes. Normal myocardial perfusion. Stress LVEF calculated 48%, although visually appears normal. Low risk study.  Coronary CTA FFR 06/24/2021: Coronary artery calcification score: Left main: 0 Left anterior descending artery: 118 Left circumflex artery: 29.3 Right coronary artery: 0.3 Total coronary calcium score of 148, places the patient at the 97th percentile for age and sex matched control.   Left main: Normal. LAD: Moderate calcified plaque 50 to 69% within the proximal/ostial LAD.  2 diagonal branches. LCx: Mild 25 to 49% calcific plaque at the ostium of OM1. RCA: Dominant.  Minimal stenosis of 0 to 24% due to mixed plaque within proximal RCA.  CT FFR analysis showed no significant stenosis.  EKG:   EKG 12/11/2022: Normal sinus rhythm with rate of 81 bpm, no evidence of ischemia, normal EKG.  Compared to 02/17/2022, no significant change.    Assessment     ICD-10-CM   1. Hypercholesteremia  E78.00 EKG 12-Lead    2. Coronary artery disease of native artery of native heart with stable angina pectoris (HCC)  I25.118 aspirin (ASPIRIN CHILDRENS) 81 MG chewable tablet    Lipid Panel With LDL/HDL Ratio    Lipoprotein A (LPA)    3. Elevated  coronary artery calcium score 06/24/21: Total coronary calcium score of 148,  97th percentile in [facility] database  R93.1 VITAMIN D 25 Hydroxy (Vit-D Deficiency, Fractures)    4. Primary hypertension  I10 olmesartan-hydrochlorothiazide (BENICAR HCT) 20-12.5 MG tablet    VITAMIN D 25 Hydroxy (Vit-D Deficiency, Fractures)      Medications Discontinued During This Encounter  Medication Reason   fluticasone (FLONASE) 50 MCG/ACT nasal spray Patient Preference   guaiFENesin 200 MG tablet Patient Preference   Propylene Glycol (SYSTANE BALANCE OP) Patient Preference   aspirin (ASPIRIN CHILDRENS) 81 MG chewable tablet Reorder   phenol (CHLORASEPTIC) 1.4 % LIQD Patient Preference   saccharomyces boulardii (FLORASTOR) 250 MG capsule Patient has not taken in last 30 days   olmesartan-hydrochlorothiazide (BENICAR HCT) 20-12.5 MG tablet Reorder   Meds ordered this encounter  Medications   aspirin (ASPIRIN CHILDRENS) 81 MG chewable tablet    Sig: Chew 1 tablet (81 mg total) by mouth every other day.   olmesartan-hydrochlorothiazide (BENICAR HCT) 20-12.5 MG tablet    Sig: Take 1 tablet by mouth every morning. Hold on weekends    Dispense:  90 tablet    Refill:  3   Orders Placed This Encounter  Procedures   Lipid Panel With LDL/HDL Ratio   Lipoprotein A (LPA)   VITAMIN D 25 Hydroxy (Vit-D Deficiency, Fractures)   EKG 12-Lead   Recommendations:   MINDA BAJAJ is a 57 y.o. 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    1. Coronary artery disease involving native coronary artery of native heart without angina pectoris Patient was recently seen in the emergency room for angina pectoris precipitated by extreme stress.   Fortunately cardiac markers were negative and EKG was normal.  I discussed with her the symptoms of angina pectoris  and although appeared to be atypical, in a female patient with known coronary disease, nitrate responsive chest pain clearly indicated above angina pectoris.  She has not been able to take aspirin due to GERD, advised her to take aspirin 81 mg every other day.  If she does not tolerate this, we could certainly consider switching her to Plavix.  I also advised her that there is no need for ED visit if she has chest pain and it is easily relieved with 1 nitroglycerin.  However if she indeed has recurrence of chest pain or has to use multiple nitroglycerin obviously she needs to go to the emergency room.  Patient is under extreme stress with regard to her parents health and also her own issues, we discussed making lifestyle changes.  2. Elevated coronary artery calcium score 06/24/21: Total coronary calcium score of 148,  97th percentile in [facility] database Markedly elevated coronary calcium score, she could not tolerate atorvastatin, however she is now tolerating simvastatin along with Zetia.  She needs lipid profile testing, will also check LPA.  3. Hypercholesteremia As dictated above, lipid profile will be checked as she is now on a new statin.  4. Primary hypertension Blood pressure is elevated today, previously well-controlled, also in the emergency room it had been well-controlled.  Patient is presently taking metoprolol tartrate 25 mg twice daily and also taking olmesartan HCT 20/12.5 mg 5 days a week 1/2 tablet.  Will increase olmesartan HCT to daily dose.  I would like to see her back in 2 months for follow-up.     Adrian Prows, MD, [facility] 12/11/2022, 6:38 PM Office: [phone] Fax: [phone] Pager: [phone]

## 2022-12-14 LAB — LIPOPROTEIN A (LPA): Lipoprotein (a): 71.5 nmol/L (ref <75.0)

## 2022-12-14 LAB — LIPID PANEL WITH LDL/HDL RATIO
Cholesterol, Total: 175 mg/dL (ref 100–199)
HDL: 63 mg/dL (ref >39)
LDL Chol Calc (NIH): 95 mg/dL (ref 0–99)
LDL/HDL Ratio: 1.5 ratio (ref 0.0–3.2)
Triglycerides: 91 mg/dL (ref 0–149)
VLDL Cholesterol Cal: 17 mg/dL (ref 5–40)

## 2022-12-14 LAB — VITAMIN D 25 HYDROXY (VIT D DEFICIENCY, FRACTURES): Vit D, 25-Hydroxy: 72.7 ng/mL (ref 30.0–100.0)

## 2023-02-15 ENCOUNTER — Other Ambulatory Visit: Payer: Self-pay | Admitting: Cardiology

## 2023-02-15 DIAGNOSIS — E7800 Pure hypercholesterolemia, unspecified: Secondary | ICD-10-CM

## 2023-02-15 DIAGNOSIS — R931 Abnormal findings on diagnostic imaging of heart and coronary circulation: Secondary | ICD-10-CM

## 2023-02-23 ENCOUNTER — Ambulatory Visit: Payer: 59 | Admitting: Cardiology

## 2023-03-03 ENCOUNTER — Encounter (HOSPITAL_COMMUNITY): Payer: Self-pay

## 2023-03-03 ENCOUNTER — Ambulatory Visit (HOSPITAL_COMMUNITY)
Admission: RE | Admit: 2023-03-03 | Discharge: 2023-03-03 | Disposition: A | Discharge status: Home / Self Care | Payer: 59 | Source: Ambulatory Visit | Attending: Physician Assistant | Admitting: Physician Assistant

## 2023-03-03 VITALS — BP 123/83 | HR 74 | Temp 98.4°F | Resp 18

## 2023-03-03 DIAGNOSIS — H109 Unspecified conjunctivitis: Secondary | ICD-10-CM

## 2023-03-03 DIAGNOSIS — J019 Acute sinusitis, unspecified: Secondary | ICD-10-CM

## 2023-03-03 MED ORDER — DOXYCYCLINE HYCLATE 100 MG PO CAPS: 100.0000 mg | ORAL_CAPSULE | Freq: Two times a day (BID) | ORAL | 0 refills | Status: DC

## 2023-03-03 MED ORDER — FLUTICASONE PROPIONATE 50 MCG/ACT NA SUSP: 1.0000 | Freq: Every day | NASAL | 0 refills | Status: DC

## 2023-03-03 MED ORDER — ERYTHROMYCIN 5 MG/GM OP OINT: TOPICAL_OINTMENT | OPHTHALMIC | 0 refills | Status: DC

## 2023-03-03 NOTE — ED Triage Notes (Signed)
Pt presents to office for nasal congestion, headache x 1 week. Pt reports she woke up with eyes matted this morning.

## 2023-03-03 NOTE — ED Provider Notes (Signed)
[facility]    CSN: [id number] Arrival date & time: 03/03/23  1548      History   Chief Complaint Chief Complaint  Patient presents with   Nasal Congestion    Nasal congestion and nose very tender and mild headache for @ 1 wk. Left red eye for 2 days. Today, Left eye still red and now watering and draining with exudate, slight discomfort. Don't feel well. - Entered by patient   Eye Pain    HPI Jamie Trujillo is a 57 y.o. female.   Patient presents today with a week plus long history of URI symptoms including nasal congestion, sinus pressure, headache, postnasal drainage, cough.  Denies any fever, chest pain, shortness of breath, nausea, vomiting, diarrhea.  Jamie Trujillo has tried expired fluticasone without improvement of symptoms.  Jamie Trujillo has not tried additional over-the-counter medications for symptom management.  Denies history of allergies, asthma, COPD.   Jamie Trujillo does not smoke.  Denies any recent antibiotics or steroids.  Jamie Trujillo reports that over the past several days her symptoms have significantly worsened prompting evaluation today.  Jamie Trujillo is also developed drainage and erythema of her left eye.  When Jamie Trujillo woke up Jamie Trujillo had significant drainage and had difficulty opening her eye.  Jamie Trujillo does wear glasses but does not wear contacts.  Denies any visual disturbance, photophobia, ocular trauma, ocular pain.  Denies any exposure to fine particulate matter or chemicals.  Jamie Trujillo is having difficulty with her daily activities as result of symptoms.    Past Medical History:  Diagnosis Date   Anxiety    Depression    Essential tremor    Heart murmur 10/03/2007   Hyperlipidemia    Hypertension     Patient Active Problem List   Diagnosis Date Noted   Chronic pain 11/15/2021   Neck pain 11/15/2021   Paresthesia 11/15/2021   Precordial pain    Hypertension 03/22/2021   Elevated triglycerides with high cholesterol 03/22/2021   Gastroesophageal reflux disease 05/03/2019   Anxiety 07/30/2017    Essential tremor 03/30/2014   Neck muscle strain 03/30/2014    Past Surgical History:  Procedure Laterality Date   FINGER SURGERY  1990   [place] SURGERY  10/02/2001   Removed     OB History   No obstetric history on file.      Home Medications    Prior to Admission medications   Medication Sig Start Date End Date Taking? Authorizing Provider  aspirin (ASPIRIN CHILDRENS) 81 MG chewable tablet Chew 1 tablet (81 mg total) by mouth every other day. 12/11/22  Yes Yates Decamp, MD  betamethasone valerate (VALISONE) 0.1 % cream Apply 1 application topically daily as needed (itching on ear). 09/21/14  Yes [provider]  clorazepate (TRANXENE) 7.5 MG tablet TAKE 1 CAPSULE DAILY MONDAY THRU THURSDAY AND IF NEEDED ON OTHER DAYS. 10/26/22  Yes Glean Salvo, NP  doxycycline (VIBRAMYCIN) 100 MG capsule Take 1 capsule (100 mg total) by mouth 2 (two) times daily. 03/03/23  Yes Danta Baumgardner K, PA-C  erythromycin ophthalmic ointment Place a 1/2 inch ribbon of ointment into the lower eyelid of the left eye twice daily for 7 days 03/03/23  Yes Kayzlee Wirtanen K, PA-C  ezetimibe (ZETIA) 10 MG tablet TAKE 1 TABLET (10 MG TOTAL) BY MOUTH DAILY AFTER SUPPER. 08/07/22 08/02/23 Yes Yates Decamp, MD  fluocinonide (LIDEX) 0.05 % external solution Apply 2 application topically daily as needed (for itching). 2 drops in each ear as needed for itching  Yes [provider]  fluticasone (FLONASE) 50 MCG/ACT nasal spray Place 1 spray into both nostrils daily. 03/03/23  Yes Erna Brossard K, PA-C  IBUPROFEN PO Take 600 mg by mouth daily as needed (takes 1-3 x week. (600mg  tablet) pain).   Yes [provider]  metoprolol tartrate (LOPRESSOR) 50 MG tablet TAKE 1 TABLET BY MOUTH TWICE A DAY 10/23/22  Yes Yates Decamp, MD  Multiple Vitamins-Minerals (MULTIVITAMIN PO) Take 1 tablet by mouth daily.   Yes [provider]  olmesartan-hydrochlorothiazide (BENICAR HCT) 20-12.5 MG tablet Take 1 tablet by  mouth every morning. Hold on weekends 12/11/22  Yes Yates Decamp, MD  omeprazole (PRILOSEC) 20 MG capsule Take 20 mg by mouth daily.   Yes [provider]  PARoxetine (PAXIL) 20 MG tablet TAKE 1 TABLET BY MOUTH EVERY DAY 05/03/22  Yes Glean Salvo, NP  primidone (MYSOLINE) 50 MG tablet TAKE 1 TABLET BY MOUTH  DAILY AND AN ADDITIONAL 1/2 TABLET DAILY AS NEEDED 02/07/22  Yes Glean Salvo, NP  simvastatin (ZOCOR) 40 MG tablet TAKE 1 TABLET BY MOUTH DAILY AT 6 PM. 02/16/23  Yes Yates Decamp, MD  nitroGLYCERIN (NITROSTAT) 0.4 MG SL tablet Place 1 tablet (0.4 mg total) under the tongue every 5 (five) minutes as needed for up to 25 days for chest pain. 04/29/21 12/11/22  Yates Decamp, MD    Family History Family History  Problem Relation Age of Onset   Breast cancer Mother        over 45   Heart disease Mother    Heart failure Mother    Atrial fibrillation Mother    Heart disease Father    Heart attack Brother    Heart disease Brother     Social History Social History   Tobacco Use   Smoking status: Never   Smokeless tobacco: Never  Vaping Use   Vaping Use: Never used  Substance Use Topics   Alcohol use: Yes    Comment: Occ on weekends    Drug use: No     Allergies   Atorvastatin calcium, Penicillins, and Penicillin g   Review of Systems Review of Systems  Constitutional:  Positive for activity change. Negative for appetite change, fatigue and fever.  HENT:  Positive for congestion, postnasal drip and sinus pressure. Negative for sneezing and sore throat.   Eyes:  Positive for discharge and redness. Negative for photophobia, pain, itching and visual disturbance.  Respiratory:  Positive for cough. Negative for shortness of breath.   Cardiovascular:  Negative for chest pain.  Gastrointestinal:  Negative for abdominal pain, diarrhea, nausea and vomiting.  Neurological:  Positive for headaches. Negative for dizziness and light-headedness.     Physical Exam Triage Vital  Signs ED Triage Vitals [03/03/23 1709]  Enc Vitals Group     BP 123/83     Pulse Rate 74     Resp 18     Temp 98.4 F (36.9 C)     Temp Source Oral     SpO2 96 %     Weight      Height      Head Circumference      Peak Flow      Pain Score      Pain Loc      Pain Edu?      Excl. in GC?    No data found.  Updated Vital Signs BP 123/83 (BP Location: Left Arm)   Pulse 74   Temp 98.4 F (36.9  C) (Oral)   Resp 18   SpO2 96%   Visual Acuity Right Eye Distance:   Left Eye Distance:   Bilateral Distance:    Right Eye Near:   Left Eye Near:    Bilateral Near:     Physical Exam Vitals reviewed.  Constitutional:      General: Jamie Trujillo is awake. Jamie Trujillo is not in acute distress.    Appearance: Normal appearance. Jamie Trujillo is well-developed. Jamie Trujillo is not ill-appearing.     Comments: Very pleasant female appears stated age in no acute distress sitting comfortably in exam room  HENT:     Head: Normocephalic and atraumatic.     Right Ear: Tympanic membrane, ear canal and external ear normal. Tympanic membrane is not erythematous or bulging.     Left Ear: Tympanic membrane, ear canal and external ear normal. Tympanic membrane is not erythematous or bulging.     Nose:     Right Sinus: No maxillary sinus tenderness or frontal sinus tenderness.     Left Sinus: No maxillary sinus tenderness or frontal sinus tenderness.     Mouth/Throat:     Pharynx: Uvula midline. Posterior oropharyngeal erythema present. No oropharyngeal exudate.     Comments: Erythema and drainage in posterior pharynx Eyes:     Extraocular Movements: Extraocular movements intact.     Conjunctiva/sclera:     Right eye: Right conjunctiva is not injected. No chemosis.    Left eye: Left conjunctiva is injected. No chemosis.    Pupils: Pupils are equal, round, and reactive to light.  Cardiovascular:     Rate and Rhythm: Normal rate and regular rhythm.     Heart sounds: Normal heart sounds, S1 normal and S2 normal. No murmur  heard. Pulmonary:     Effort: Pulmonary effort is normal.     Breath sounds: Normal breath sounds. No wheezing, rhonchi or rales.     Comments: Clear to auscultation bilaterally Psychiatric:        Behavior: Behavior is cooperative.      UC Treatments / Results  Labs (all labs ordered are listed, but only abnormal results are displayed) Labs Reviewed - No data to display  EKG   Radiology No results found.  Procedures Procedures (including critical care time)  Medications Ordered in UC Medications - No data to display  Initial Impression / Assessment and Plan / UC Course  I have reviewed the triage vital signs and the nursing notes.  Pertinent labs & imaging results that were available during my care of the patient were reviewed by me and considered in my medical decision making (see chart for details).     Patient is well-appearing, afebrile, nontoxic, nontachycardic.  No indication for viral testing as Jamie Trujillo has been symptomatic for over a week and this would not change management.  Given prolonged and recent worsening of symptoms will cover for secondary bacterial infection causing sinus symptoms.  Jamie Trujillo is allergic to penicillins and so we will use doxycycline.  Discussed that Jamie Trujillo is to avoid prolonged sun exposure while on this medication.  Will also treat for bacterial conjunctivitis given clinical presentation.  Jamie Trujillo was started on erythromycin ointment with instruction to wash her hands prior to handling medication and avoid touching tip of medication bottle to the eye to prevent contamination of the medicine.  Jamie Trujillo can use over-the-counter medication including Mucinex, Flonase, Tylenol.  Also recommended nasal saline and sinus rinses.  Discussed that if her symptoms or not improving within a few days Jamie Trujillo should  return for reevaluation.  If Jamie Trujillo has any worsening or changing symptoms including worsening congestion, worsening cough, fever, nausea, vomiting Jamie Trujillo needs to be seen  immediately.  Strict return precautions given.  Final Clinical Impressions(s) / UC Diagnoses   Final diagnoses:  Acute non-recurrent sinusitis, unspecified location  Bacterial conjunctivitis of left eye     Discharge Instructions      We are treating you for a bacterial sinus infection.  Take doxycycline 100 mg twice daily for 7 days.  Stay out of the sun while on this medication.  Use fluticasone to help with your nasal congestion.  You can use over-the-counter medication including Mucinex, Flonase as well as nasal saline and sinus rinses for additional symptom relief.  Make sure that you rest and drink plenty of fluid.  If your symptoms or not improving or if anything worsens and you have fever, worsening congestion, worsening cough, nausea, vomiting you need to be seen immediately.  I am concerned that you have a bacterial infection in your left eye.  Use erythromycin ointment twice daily.  Do not touch the tip of medication bottle to the eye and make sure to wash your hands prior to handling medication to prevent contamination of the medicine.  Clean the eye with warm compresses a few times per day.  You can use lubricating eyedrops/artificial tears for additional symptom relief.  If your symptoms or not improving or if anything worsens and you have vision change, headache, nausea, vomiting, dizziness you need to be seen immediately.     ED Prescriptions     Medication Sig Dispense Auth. Provider   doxycycline (VIBRAMYCIN) 100 MG capsule Take 1 capsule (100 mg total) by mouth 2 (two) times daily. 14 capsule Margret Moat K, PA-C   fluticasone (FLONASE) 50 MCG/ACT nasal spray Place 1 spray into both nostrils daily. 16 g Mitra Duling K, PA-C   erythromycin ophthalmic ointment Place a 1/2 inch ribbon of ointment into the lower eyelid of the left eye twice daily for 7 days 3.5 g Nycole Kawahara K, PA-C      PDMP not reviewed this encounter.   Jeani Hawking, PA-C 03/03/23 1731

## 2023-03-03 NOTE — Discharge Instructions (Signed)
We are treating you for a bacterial sinus infection.  Take doxycycline 100 mg twice daily for 7 days.  Stay out of the sun while on this medication.  Use fluticasone to help with your nasal congestion.  You can use over-the-counter medication including Mucinex, Flonase as well as nasal saline and sinus rinses for additional symptom relief.  Make sure that you rest and drink plenty of fluid.  If your symptoms or not improving or if anything worsens and you have fever, worsening congestion, worsening cough, nausea, vomiting you need to be seen immediately.  I am concerned that you have a bacterial infection in your left eye.  Use erythromycin ointment twice daily.  Do not touch the tip of medication bottle to the eye and make sure to wash your hands prior to handling medication to prevent contamination of the medicine.  Clean the eye with warm compresses a few times per day.  You can use lubricating eyedrops/artificial tears for additional symptom relief.  If your symptoms or not improving or if anything worsens and you have vision change, headache, nausea, vomiting, dizziness you need to be seen immediately.

## 2023-03-09 ENCOUNTER — Other Ambulatory Visit: Payer: Self-pay

## 2023-03-09 ENCOUNTER — Ambulatory Visit: Payer: 59 | Admitting: Cardiology

## 2023-03-09 ENCOUNTER — Encounter: Payer: Self-pay | Admitting: Cardiology

## 2023-03-09 VITALS — BP 121/79 | HR 72 | Ht 62.0 in | Wt 200.0 lb

## 2023-03-09 DIAGNOSIS — E7800 Pure hypercholesterolemia, unspecified: Secondary | ICD-10-CM

## 2023-03-09 DIAGNOSIS — I25118 Atherosclerotic heart disease of native coronary artery with other forms of angina pectoris: Secondary | ICD-10-CM

## 2023-03-09 DIAGNOSIS — I209 Angina pectoris, unspecified: Secondary | ICD-10-CM

## 2023-03-09 DIAGNOSIS — I10 Essential (primary) hypertension: Secondary | ICD-10-CM

## 2023-03-09 MED ORDER — NITROGLYCERIN 0.4 MG SL SUBL: 0.4000 mg | SUBLINGUAL_TABLET | SUBLINGUAL | 3 refills | Status: DC | PRN

## 2023-03-09 NOTE — Progress Notes (Unsigned)
Primary Physician/Referring:  Merri Brunette, MD  Patient ID: Jamie Trujillo, female    DOB: 1966-06-15, 57 y.o.   MRN: [id number]  Chief Complaint  Patient presents with   Coronary Artery Disease   Hyperlipidemia   Hypertension   Follow-up    2 month   HPI:    Jamie Trujillo  is a 57 y.o. 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    She presents here for 3-month office visit, states that she is feeling the best she has in quite a while.  States that she has not had any further chest pain, also states that her blood pressure has been well-controlled.  She is also come to terms with her profession and anxiety and stress and is trying to deal with his in a much positive way.  Past Medical History:  Diagnosis Date   Anxiety    Depression    Essential tremor    Heart murmur 10/03/2007   Hyperlipidemia    Hypertension    Past Surgical History:  Procedure Laterality Date   FINGER SURGERY  1990   GALLBLADDER SURGERY  10/02/2001   Removed    Family History  Problem Relation Age of Onset   Breast cancer Mother        over 57   Heart disease Mother    Heart failure Mother    Atrial fibrillation Mother    Heart disease Father    Heart attack Brother    Heart disease Brother     Social History   Tobacco Use   Smoking status: Never   Smokeless tobacco: Never  Substance Use Topics   Alcohol use: Yes    Comment: Occ on weekends    Marital Status: Married  ROS  Review of Systems  Cardiovascular:  Positive for chest pain. Negative for dyspnea on exertion and leg swelling.   Objective  Blood pressure 121/79, pulse 72, height 5\' 2"  (1.575 m), weight 200 lb (90.7 kg), SpO2 98 %. Body mass index is  36.58 kg/m.     03/09/2023    2:57 PM 03/03/2023    5:09 PM 12/11/2022    4:40 PM  Vitals with BMI  Height 5\' 2"     Weight 200 lbs    BMI 36.57    Systolic 121 123 409  Diastolic 79 83 88  Pulse 72 74     Physical Exam Constitutional:      Appearance: She is obese.  Neck:     Vascular: No carotid bruit or JVD.  Cardiovascular:     Rate and Rhythm: Normal rate and regular rhythm.     Pulses: Intact distal pulses.     Heart sounds: Normal heart sounds. No murmur heard.    No gallop.  Pulmonary:     Effort: Pulmonary effort is normal.     Breath sounds: Normal breath sounds.  Abdominal:     General: Bowel sounds are normal.     Palpations: Abdomen is soft.  Musculoskeletal:     Right lower leg: No edema.     Left lower leg: No edema.     Laboratory examination:   Lab Results  Component Value Date   NA 140 12/10/2022   K 4.0 12/10/2022   CO2 28 12/10/2022  GLUCOSE 75 12/10/2022   BUN 18 12/10/2022   CREATININE 0.84 12/10/2022   CALCIUM 9.9 12/10/2022   EGFR 68 06/16/2021   GFRNONAA >60 12/10/2022    Lab Results  Component Value Date   WBC 7.8 12/10/2022   HGB 14.7 12/10/2022   HCT 44.5 12/10/2022   MCV 94.9 12/10/2022   PLT 260 12/10/2022    Lab Results  Component Value Date   CHOL 175 12/13/2022   HDL 63 12/13/2022   LDLCALC 95 12/13/2022   LDLDIRECT 81 07/13/2021   TRIG 91 12/13/2022    Lipoprotein (a) <75.0 nmol/L 71.5   External labs:   Labs 04/10/2022:  Total cholesterol 229, triglycerides 238, HDL 51, LDL 130.  TSH 2.70, normal.Vitamin D 58.  BUN 20, creatinine 0.88, serum glucose 106 mg, potassium 4.1 EGFR 73 mL.  Hb 14.3/HCT 42.7, platelets 249, normal indicis.  Labs 09/13/2021:  A1c 5.8%.  Total cholesterol 152, triglycerides 95, HDL 63, LDL 70.  Non-HDL cholesterol 89.  Labs 03/24/2021: Total cholesterol 236, triglycerides 121, HDL 58, LDL 154. Non-HDL cholesterol 178.  Medications and allergies   Allergies  Allergen  Reactions   Atorvastatin Calcium Other (See Comments)    Myalgia   Penicillins    Penicillin G Rash    Medication list after today's encounter    Current Outpatient Medications:    betamethasone valerate (VALISONE) 0.1 % cream, Apply 1 application topically daily as needed (itching on ear)., Disp: , Rfl:    clorazepate (TRANXENE) 7.5 MG tablet, TAKE 1 CAPSULE DAILY MONDAY THRU THURSDAY AND IF NEEDED ON OTHER DAYS., Disp: 90 tablet, Rfl: 0   doxycycline (VIBRAMYCIN) 100 MG capsule, Take 1 capsule (100 mg total) by mouth 2 (two) times daily., Disp: 14 capsule, Rfl: 0   erythromycin ophthalmic ointment, Place a 1/2 inch ribbon of ointment into the lower eyelid of the left eye twice daily for 7 days, Disp: 3.5 g, Rfl: 0   ezetimibe (ZETIA) 10 MG tablet, TAKE 1 TABLET (10 MG TOTAL) BY MOUTH DAILY AFTER SUPPER., Disp: 90 tablet, Rfl: 3   fluocinonide (LIDEX) 0.05 % external solution, Apply 2 application topically daily as needed (for itching). 2 drops in each ear as needed for itching, Disp: , Rfl:    IBUPROFEN PO, Take 600 mg by mouth daily as needed (takes 1-3 x week. (600mg  tablet) pain)., Disp: , Rfl:    ipratropium (ATROVENT) 0.06 % nasal spray, Place 1 spray into both nostrils 3 (three) times daily., Disp: , Rfl:    metoprolol tartrate (LOPRESSOR) 50 MG tablet, TAKE 1 TABLET BY MOUTH TWICE A DAY, Disp: 180 tablet, Rfl: 3   Multiple Vitamins-Minerals (MULTIVITAMIN PO), Take 1 tablet by mouth daily., Disp: , Rfl:    olmesartan-hydrochlorothiazide (BENICAR HCT) 20-12.5 MG tablet, Take 1 tablet by mouth every morning. Hold on weekends, Disp: 90 tablet, Rfl: 3   omeprazole (PRILOSEC) 20 MG capsule, Take 20 mg by mouth daily., Disp: , Rfl:    PARoxetine (PAXIL) 20 MG tablet, TAKE 1 TABLET BY MOUTH EVERY DAY, Disp: 90 tablet, Rfl: 3   primidone (MYSOLINE) 50 MG tablet, TAKE 1 TABLET BY MOUTH  DAILY AND AN ADDITIONAL 1/2 TABLET DAILY AS NEEDED (Patient taking differently: Monday-thursday), Disp: 135  tablet, Rfl: 4   simvastatin (ZOCOR) 40 MG tablet, TAKE 1 TABLET BY MOUTH DAILY AT 6 PM., Disp: 90 tablet, Rfl: 0   aspirin (ASPIRIN CHILDRENS) 81 MG chewable tablet, Chew 1 tablet (81 mg total) by mouth every other day. (Patient  not taking: Reported on 03/09/2023), Disp: , Rfl:    nitroGLYCERIN (NITROSTAT) 0.4 MG SL tablet, Place 1 tablet (0.4 mg total) under the tongue every 5 (five) minutes as needed for up to 25 days for chest pain., Disp: 25 tablet, Rfl: 3   Radiology:   CT angiogram of the chest and abdomen 06/09/2021: 1. Normal contour and caliber of the thoracic and abdominal aorta. No evidence of aneurysm, dissection, or other acute aortic pathology. Mild, scattered aortic atherosclerosis. 2. Coronary artery disease. 3. Hepatic steatosis. 4. Status post cholecystectomy. 5. Descending and sigmoid diverticulosis without evidence of acute diverticulitis.  Cardiac Studies:   PCV ECHOCARDIOGRAM COMPLETE 05/13/2021 Left ventricle cavity is normal in size. Mild concentric hypertrophy of the left ventricle. Normal global wall motion. Normal LV systolic function with visual EF 50-55%. Normal diastolic filling pattern. Mild (Grade I) mitral regurgitation. Unlike study in 2015, PFO not appreciated.    PCV MYOCARDIAL PERFUSION WO LEXISCAN 05/23/2021 Exercise nuclear stress test was performed using Bruce protocol. Patient reached 6.2 METS, and 88% of age predicted maximum heart rate. Exercise capacity was low. No chest pain reported. Heart rate and hemodynamic response were normal. Stress EKG revealed no ischemic changes. Normal myocardial perfusion. Stress LVEF calculated 48%, although visually appears normal. Low risk study.  Coronary CTA FFR 06/24/2021: Coronary artery calcification score: Left main: 0 Left anterior descending artery: 118 Left circumflex artery: 29.3 Right coronary artery: 0.3 Total coronary calcium score of 148, places the patient at the 97th percentile for age and sex  matched control.   Left main: Normal. LAD: Moderate calcified plaque 50 to 69% within the proximal/ostial LAD.  2 diagonal branches. LCx: Mild 25 to 49% calcific plaque at the ostium of OM1. RCA: Dominant.  Minimal stenosis of 0 to 24% due to mixed plaque within proximal RCA.  CT FFR analysis showed no significant stenosis.  EKG:   EKG 12/11/2022: Normal sinus rhythm with rate of 81 bpm, no evidence of ischemia, normal EKG.  Compared to 02/17/2022, no significant change.    Assessment     ICD-10-CM   1. Coronary artery disease of native artery of native heart with stable angina pectoris (HCC)  I25.118     2. Hypercholesteremia  E78.00     3. Primary hypertension  I10       Medications Discontinued During This Encounter  Medication Reason   fluticasone (FLONASE) 50 MCG/ACT nasal spray Completed Course   No orders of the defined types were placed in this encounter.  No orders of the defined types were placed in this encounter.  Recommendations:   Jamie Trujillo is a 57 y.o. 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    1. Coronary artery disease of native artery of native heart with stable angina pectoris (HCC) Patient remained stable, since she has been aggressive medical therapy including statins which she is tolerating, Zetia, metoprolol tartrate, she has not had any further episodes of angina no clinical evidence of heart failure.  2. Hypercholesteremia Lipids under good control, I will accept LDL <100 baseline LDL was around 160.  She needs to repeat lipid profile testing as she is no compliant with simvastatin and Zetia combination.  She can follow-up with her PCP regarding this.  3. Primary  hypertension Since addition  of losartan HCT, blood pressure in excellent control.  Overall she remains asymptomatic, I will see her back on a as needed basis.  Other orders - ipratropium (ATROVENT) 0.06 % nasal spray; Place 1 spray into both nostrils 3 (three) times daily.    Yates Decamp, MD, [facility] 03/11/2023, 8:25 AM Office: [phone] Fax: [phone] Pager: [phone]

## 2023-05-14 ENCOUNTER — Encounter: Payer: Self-pay | Admitting: Neurology

## 2023-05-14 ENCOUNTER — Ambulatory Visit: Payer: 59 | Admitting: Neurology

## 2023-05-14 VITALS — BP 134/74 | HR 86 | Ht 62.5 in | Wt 205.8 lb

## 2023-05-14 DIAGNOSIS — G8929 Other chronic pain: Secondary | ICD-10-CM

## 2023-05-14 DIAGNOSIS — R202 Paresthesia of skin: Secondary | ICD-10-CM | POA: Diagnosis not present

## 2023-05-14 DIAGNOSIS — M542 Cervicalgia: Secondary | ICD-10-CM | POA: Diagnosis not present

## 2023-05-14 DIAGNOSIS — G250 Essential tremor: Secondary | ICD-10-CM | POA: Diagnosis not present

## 2023-05-14 MED ORDER — PRIMIDONE 50 MG PO TABS: ORAL_TABLET | ORAL | 4 refills | Status: AC

## 2023-05-14 MED ORDER — PAROXETINE HCL 20 MG PO TABS: 30.0000 mg | ORAL_TABLET | Freq: Every day | ORAL | 3 refills | Status: DC

## 2023-05-14 MED ORDER — CLORAZEPATE DIPOTASSIUM 7.5 MG PO TABS: ORAL_TABLET | ORAL | 3 refills | Status: AC

## 2023-05-14 NOTE — Progress Notes (Signed)
ASSESSMENT AND PLAN 57 y.o. year old female     Essential Tremor Worsening depression anxiety -Stable, current regimen works well -Continue Tranxene 7.5 mg tablet, once daily Monday-Thursday, if needed other days -Continue primidone 50 mg daily, additional half tablet daily PRN Increased Paxil from 20 to 30 mg daily, encouraged her to continue follow-up with primary care or even psychologist for her worsening mood disorder  HISTORY  Jamie Trujillo is a 57 year old female, seen in request by Dr. Aviva Signs, for evaluation of numbness worsening neck pain, her primary care physician is Dr. Renne Crigler, [facility], initial evaluation was on November 15, 2021   I reviewed and summarized the referring note. PMHX. 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  In 2023, she also noticed left toe numbness, she also complains of pain from her back radiating to her spine, muscle tightness,  She denies gait abnormality, denies incontinence  X-ray of cervical spine showed cervical degenerative changes,   She also presented to emergency room September 2022 for significant headache, dizziness, chest pain  Was found to have significantly elevated blood pressure 182/113, now on polypharmacy treatment for her blood pressure, also hyperlipidemia,  She was also seen by cardiologist Dr. Nadara Eaton, evidence of coronary artery disease on aspirin 81 mg daily  Since then she was started on  Crestor 20 mg daily, complains of diffuse body achiness, lack of stamina,   Update December 12, 2021: Electrodiagnostic study today showed no significant abnormality. 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  See HPI  ALLERGIES: Allergies  Allergen Reactions   Atorvastatin Calcium Other (See Comments)    Myalgia   Penicillins    Penicillin G Rash    HOME MEDICATIONS: Outpatient Medications Prior to Visit  Medication Sig Dispense Refill   clorazepate (TRANXENE) 7.5 MG tablet TAKE 1 CAPSULE DAILY MONDAY THRU THURSDAY AND IF NEEDED ON OTHER DAYS. 90 tablet 0   cyclobenzaprine (FLEXERIL) 10 MG tablet Take 10 mg by mouth as needed for muscle spasms.     ezetimibe (ZETIA) 10 MG tablet TAKE 1 TABLET (10 MG TOTAL) BY MOUTH DAILY AFTER SUPPER. 90 tablet 3   fluocinonide (LIDEX) 0.05 % external solution Apply 2 application topically daily as needed (for itching). 2 drops in each ear as  needed for itching     hydrocortisone 2.5 % cream Apply 1 Application topically 2 (two) times daily.     IBUPROFEN PO Take 600 mg by mouth daily as needed (takes 1-3 x week. (600mg  tablet) pain).     metoprolol  tartrate (LOPRESSOR) 50 MG tablet TAKE 1 TABLET BY MOUTH TWICE A DAY 180 tablet 3   Multiple Vitamins-Minerals (MULTIVITAMIN PO) Take 1 tablet by mouth daily.     olmesartan-hydrochlorothiazide (BENICAR HCT) 20-12.5 MG tablet Take 1 tablet by mouth every morning. Hold on weekends 90 tablet 3   omeprazole (PRILOSEC) 20 MG capsule Take 20 mg by mouth daily.     PARoxetine (PAXIL) 20 MG tablet TAKE 1 TABLET BY MOUTH EVERY DAY 90 tablet 3   primidone (MYSOLINE) 50 MG tablet TAKE 1 TABLET BY MOUTH  DAILY AND AN ADDITIONAL 1/2 TABLET DAILY AS NEEDED (Patient taking differently: Monday-thursday) 135 tablet 4   simvastatin (ZOCOR) 40 MG tablet TAKE 1 TABLET BY MOUTH DAILY AT 6 PM. 90 tablet 0   nitroGLYCERIN (NITROSTAT) 0.4 MG SL tablet Place 1 tablet (0.4 mg total) under the tongue every 5 (five) minutes as needed for up to 25 days for chest pain. 25 tablet 3   aspirin (ASPIRIN CHILDRENS) 81 MG chewable tablet Chew 1 tablet (81 mg total) by mouth every other day. (Patient not taking: Reported on 05/14/2023)     betamethasone valerate (VALISONE) 0.1 % cream Apply 1 application topically daily as needed (itching on ear).     doxycycline (VIBRAMYCIN) 100 MG capsule Take 1 capsule (100 mg total) by mouth 2 (two) times daily. 14 capsule 0   erythromycin ophthalmic ointment Place a 1/2 inch ribbon of ointment into the lower eyelid of the left eye twice daily for 7 days 3.5 g 0   ipratropium (ATROVENT) 0.06 % nasal spray Place 1 spray into both nostrils 3 (three) times daily.     No facility-administered medications prior to visit.    PAST MEDICAL HISTORY: Past Medical History:  Diagnosis Date   Anxiety    Depression    Essential tremor    Heart murmur 10/03/2007   Hyperlipidemia    Hypertension     PAST SURGICAL HISTORY: Past Surgical History:  Procedure Laterality Date   FINGER SURGERY  1990   GALLBLADDER SURGERY  10/02/2001   Removed     FAMILY HISTORY: Family History  Problem Relation Age  of Onset   Breast cancer Mother        over 51   Heart disease Mother    Heart failure Mother    Atrial fibrillation Mother    Heart disease Father    Heart attack Brother    Heart disease Brother     SOCIAL HISTORY: Social History   Socioeconomic History   Marital status: Married    Spouse name: Dorinda Hill   Number of children: 0   Years of education: 12+   Highest education level: Doctorate  Occupational History   Occupation: Oral Surgon  Tobacco Use   Smoking status: Never   Smokeless tobacco: Never  Vaping Use   Vaping status: Never Used  Substance and Sexual Activity   Alcohol use: Yes    Alcohol/week: 2.0 standard drinks of alcohol    Types: 2 Standard drinks or equivalent per week    Comment: Occ on weekends    Drug use: No   Sexual activity: Yes    Birth control/protection: None  Other Topics Concern   Not on  file  Social History Narrative   Patient is an Transport planner.    Patient lives at home with husband Grayling Congress.    Patient has no children.    Patient is working at a Theme park manager.    Patient is right handed.    Social Determinants of Health   Financial Resource Strain: Not on file  Food Insecurity: Not on file  Transportation Needs: Not on file  Physical Activity: Not on file  Stress: Not on file  Social Connections: Not on file  Intimate Partner Violence: Not on file   PHYSICAL EXAM  Vitals:   05/14/23 0850  BP: 134/74  Pulse: 86  Weight: 205 lb 12.8 oz (93.4 kg)  Height: 5' 2.5" (1.588 m)   Body mass index is 37.04 kg/m.   PHYSICAL EXAMNIATION:  Gen: NAD, conversant, well nourised, well groomed                     Cardiovascular: Regular rate rhythm, no peripheral edema, warm, nontender. Eyes: Conjunctivae clear without exudates or hemorrhage Neck: Supple, no carotid bruits. Pulmonary: Clear to auscultation bilaterally   NEUROLOGICAL EXAM:  MENTAL STATUS: Speech/cognition: Awake, alert oriented to history taking and casual  conversation  CRANIAL NERVES: CN II: Visual fields are full to confrontation.  Pupils are round equal and briskly reactive to light. CN III, IV, VI: extraocular movement are normal. No ptosis. CN V: Facial sensation is intact to pinprick in all 3 divisions bilaterally. Corneal responses are intact.  CN VII: Face is symmetric with normal eye closure and smile. CN VIII: Hearing is normal to casual conversation CN IX, X: Palate elevates symmetrically. Phonation is normal. CN XI: Head turning and shoulder shrug are intact CN XII: Tongue is midline with normal movements and no atrophy.  MOTOR: Mild bilateral hand action tremor, normal strength, no rigidity no bradykinesia  REFLEXES: Reflexes are 2+ and symmetric at the biceps, triceps, knees, and ankles. Plantar responses are flexor.  SENSORY: Intact to light touch, pinprick, positional and vibratory sensation are intact in fingers and toes.  COORDINATION: Rapid alternating movements and fine finger movements are intact. There is no dysmetria on finger-to-nose and heel-knee-shin.    GAIT/STANCE: Posture is normal. Gait is steady with normal steps, base, arm swing, and turning. Heel and toe walking are normal. Tandem gait is normal.  Romberg is absent.    DIAGNOSTIC DATA (LABS, IMAGING, TESTING) - I reviewed patient records, labs, notes, testing and imaging myself where available.  Lab Results  Component Value Date   WBC 7.8 12/10/2022   HGB 14.7 12/10/2022   HCT 44.5 12/10/2022   MCV 94.9 12/10/2022   PLT 260 12/10/2022      Component Value Date/Time   NA 140 12/10/2022 1603   NA 142 06/16/2021 0757   K 4.0 12/10/2022 1603   CL 107 12/10/2022 1603   CO2 28 12/10/2022 1603   GLUCOSE 75 12/10/2022 1603   BUN 18 12/10/2022 1603   BUN 17 06/16/2021 0757   CREATININE 0.84 12/10/2022 1603   CALCIUM 9.9 12/10/2022 1603   PROT 7.4 06/09/2021 1320   ALBUMIN 4.1 06/09/2021 1320   AST 25 06/09/2021 1320   ALT 30 06/09/2021 1320    ALKPHOS 68 06/09/2021 1320   BILITOT 0.7 06/09/2021 1320   GFRNONAA >60 12/10/2022 1603   Lab Results  Component Value Date   CHOL 175 12/13/2022   HDL 63 12/13/2022   LDLCALC 95 12/13/2022   LDLDIRECT 81 07/13/2021  TRIG 91 12/13/2022   Lab Results  Component Value Date   HGBA1C 5.7 (H) 02/07/2022   Lab Results  Component Value Date   VITAMINB12 472 02/07/2022   Lab Results  Component Value Date   TSH 1.473 06/09/2021    Margie Ege, AGNP-C, DNP 05/14/2023, 9:10 AM [facility] [address], [address] [place], [place] [address] [phone]

## 2023-05-18 ENCOUNTER — Encounter: Payer: Self-pay | Admitting: Cardiology

## 2023-05-19 NOTE — Progress Notes (Signed)
Labs 04/20/2023:  Hb 15.1/HCT 46.2, platelets 350, normal indicis.  Serum glucose 108 mg, BUN 19, creatinine 0.92, EGFR 73 mL, potassium 4.7, LFTs normal.  A1c 5.8%.  TSH normal at 1.600.  Vitamin D66.5.  Total cholesterol 199, triglycerides 93, HDL 65, LDL 117.

## 2023-05-24 ENCOUNTER — Other Ambulatory Visit: Payer: Self-pay | Admitting: Cardiology

## 2023-05-24 DIAGNOSIS — R931 Abnormal findings on diagnostic imaging of heart and coronary circulation: Secondary | ICD-10-CM

## 2023-05-24 DIAGNOSIS — E7800 Pure hypercholesterolemia, unspecified: Secondary | ICD-10-CM

## 2023-06-09 ENCOUNTER — Other Ambulatory Visit: Payer: Self-pay | Admitting: Neurology

## 2023-06-22 ENCOUNTER — Other Ambulatory Visit: Payer: Self-pay | Admitting: Internal Medicine

## 2023-06-22 DIAGNOSIS — Z1231 Encounter for screening mammogram for malignant neoplasm of breast: Secondary | ICD-10-CM

## 2023-06-23 ENCOUNTER — Other Ambulatory Visit: Payer: Self-pay | Admitting: Cardiology

## 2023-06-23 DIAGNOSIS — E7800 Pure hypercholesterolemia, unspecified: Secondary | ICD-10-CM

## 2023-06-23 DIAGNOSIS — R931 Abnormal findings on diagnostic imaging of heart and coronary circulation: Secondary | ICD-10-CM

## 2023-06-26 MED ORDER — SIMVASTATIN 40 MG PO TABS
40.0000 mg | ORAL_TABLET | Freq: Every day | ORAL | 3 refills | Status: DC
Start: 2023-06-26 — End: 2024-04-22

## 2023-07-17 DIAGNOSIS — H60543 Acute eczematoid otitis externa, bilateral: Secondary | ICD-10-CM | POA: Insufficient documentation

## 2023-07-17 DIAGNOSIS — J3489 Other specified disorders of nose and nasal sinuses: Secondary | ICD-10-CM | POA: Insufficient documentation

## 2023-07-17 DIAGNOSIS — H6122 Impacted cerumen, left ear: Secondary | ICD-10-CM | POA: Insufficient documentation

## 2023-07-27 ENCOUNTER — Ambulatory Visit
Admission: RE | Admit: 2023-07-27 | Discharge: 2023-07-27 | Disposition: A | Discharge status: Home / Self Care | Payer: 59 | Source: Ambulatory Visit | Attending: Internal Medicine | Admitting: Internal Medicine

## 2023-07-27 DIAGNOSIS — Z1231 Encounter for screening mammogram for malignant neoplasm of breast: Secondary | ICD-10-CM

## 2023-10-17 ENCOUNTER — Other Ambulatory Visit: Payer: Self-pay

## 2023-10-17 DIAGNOSIS — E7800 Pure hypercholesterolemia, unspecified: Secondary | ICD-10-CM

## 2023-10-17 MED ORDER — EZETIMIBE 10 MG PO TABS: 10.0000 mg | ORAL_TABLET | Freq: Every day | ORAL | 1 refills | Status: DC

## 2023-10-21 ENCOUNTER — Encounter: Payer: Self-pay | Admitting: Cardiology

## 2023-10-23 DIAGNOSIS — M25512 Pain in left shoulder: Secondary | ICD-10-CM | POA: Insufficient documentation

## 2023-11-19 LAB — LAB REPORT - SCANNED
A1c: 6.1
Calcium: 9.9
EGFR: 63
TSH: 1.95 (ref 0.41–5.90)

## 2023-11-23 NOTE — Progress Notes (Signed)
 Labs 11/19/2023:  Hb 16.0/HCT 48.5, platelets 350, normal indicis.  Serum glucose 94 mg, BUN 13, creatinine 1.04, potassium 4.5, EGFR 63 mL.  AST normal, ALT mildly elevated at 47 (0-32) stable from previous.  A1c 6.1%.  TSH normal at 1.950.  Total cholesterol 221, triglycerides 134, HDL 75, LDL 123.

## 2024-03-24 ENCOUNTER — Other Ambulatory Visit: Payer: Self-pay | Admitting: Cardiology

## 2024-03-24 DIAGNOSIS — R931 Abnormal findings on diagnostic imaging of heart and coronary circulation: Secondary | ICD-10-CM

## 2024-03-24 DIAGNOSIS — I10 Essential (primary) hypertension: Secondary | ICD-10-CM

## 2024-03-31 ENCOUNTER — Other Ambulatory Visit: Payer: Self-pay | Admitting: Cardiology

## 2024-03-31 DIAGNOSIS — R931 Abnormal findings on diagnostic imaging of heart and coronary circulation: Secondary | ICD-10-CM

## 2024-03-31 DIAGNOSIS — I10 Essential (primary) hypertension: Secondary | ICD-10-CM

## 2024-04-22 ENCOUNTER — Other Ambulatory Visit: Payer: Self-pay

## 2024-04-22 ENCOUNTER — Other Ambulatory Visit: Payer: Self-pay | Admitting: Cardiology

## 2024-04-22 DIAGNOSIS — I10 Essential (primary) hypertension: Secondary | ICD-10-CM

## 2024-04-22 DIAGNOSIS — E7800 Pure hypercholesterolemia, unspecified: Secondary | ICD-10-CM

## 2024-04-22 DIAGNOSIS — R931 Abnormal findings on diagnostic imaging of heart and coronary circulation: Secondary | ICD-10-CM

## 2024-04-22 MED ORDER — SIMVASTATIN 40 MG PO TABS: 40.0000 mg | ORAL_TABLET | Freq: Every day | ORAL | 0 refills | Status: DC

## 2024-04-22 MED ORDER — EZETIMIBE 10 MG PO TABS: 10.0000 mg | ORAL_TABLET | Freq: Every day | ORAL | 0 refills | Status: DC

## 2024-04-22 MED ORDER — METOPROLOL TARTRATE 50 MG PO TABS: 50.0000 mg | ORAL_TABLET | Freq: Two times a day (BID) | ORAL | 0 refills | Status: DC

## 2024-04-22 MED ORDER — OLMESARTAN MEDOXOMIL-HCTZ 20-12.5 MG PO TABS: 1.0000 | ORAL_TABLET | ORAL | 0 refills | Status: DC

## 2024-05-06 ENCOUNTER — Other Ambulatory Visit: Payer: Self-pay | Admitting: *Deleted

## 2024-05-06 DIAGNOSIS — E7800 Pure hypercholesterolemia, unspecified: Secondary | ICD-10-CM

## 2024-05-06 DIAGNOSIS — R931 Abnormal findings on diagnostic imaging of heart and coronary circulation: Secondary | ICD-10-CM

## 2024-05-06 DIAGNOSIS — I10 Essential (primary) hypertension: Secondary | ICD-10-CM

## 2024-05-06 MED ORDER — METOPROLOL TARTRATE 50 MG PO TABS: 50.0000 mg | ORAL_TABLET | Freq: Two times a day (BID) | ORAL | 0 refills | Status: DC

## 2024-05-06 MED ORDER — EZETIMIBE 10 MG PO TABS: 10.0000 mg | ORAL_TABLET | Freq: Every day | ORAL | 0 refills | Status: AC

## 2024-05-19 ENCOUNTER — Other Ambulatory Visit: Payer: Self-pay | Admitting: Cardiology

## 2024-05-19 DIAGNOSIS — I10 Essential (primary) hypertension: Secondary | ICD-10-CM

## 2024-05-19 DIAGNOSIS — R931 Abnormal findings on diagnostic imaging of heart and coronary circulation: Secondary | ICD-10-CM

## 2024-06-09 LAB — LAB REPORT - SCANNED
A1c: 5.8
EGFR: 75

## 2024-06-10 ENCOUNTER — Other Ambulatory Visit: Payer: Self-pay | Admitting: Registered Nurse

## 2024-06-10 DIAGNOSIS — R748 Abnormal levels of other serum enzymes: Secondary | ICD-10-CM

## 2024-06-13 ENCOUNTER — Inpatient Hospital Stay
Admission: RE | Admit: 2024-06-13 | Discharge: 2024-06-13 | Discharge status: Home / Self Care | Source: Ambulatory Visit | Attending: Registered Nurse | Admitting: Registered Nurse

## 2024-06-13 DIAGNOSIS — R748 Abnormal levels of other serum enzymes: Secondary | ICD-10-CM

## 2024-06-19 NOTE — Progress Notes (Signed)
 Cardiology Office Note    Date:  06/21/2024  ID:  Jamie Trujillo, DOB [birth year], MRN [id number] PCP:  Clarice Nottingham, MD  Cardiologist:  Gordy Bergamo, MD  Electrophysiologist:  None   Chief Complaint: Follow up for CAD   History of Present Illness: .    Jamie Trujillo is a 58 y.o. female with visit-pertinent history of moderate coronary disease by coronary CTA in 06/2021, hypertension, hyperlipidemia, anxiety and benign essential tremor.  Patient evaluated by Dr. Bergamo initially in 2022 with reports of chest pain.  Echocardiogram in 05/2021 indicated normal LV systolic function with EF 50 to 55%, normal diastolic filling pattern, mild concentric LVH.  MPI in 05/2021 was normal and low risk.  Coronary CTA in 06/2021 indicated total coronary calcium  score of 148, this was 97th percentile for age and sex matched control ostial to proximal LAD with 50 to 69% stenosis due to calcified plaques, noted to be closer to 50%, accuracy was limited due to blooming artifact, CT FFR analysis showed no significant stenosis.   Patient was last seen in clinic by Dr. Bergamo in 03/2023, noted to feel the best she had in a a while.  Patient denied any further chest pain, and reported that her blood pressure had been very well-controlled.  Today she presents for follow-up and concerns regarding fatigue.  Patient reports that she was recently notified that her history of fatty liver disease has worsened, she has been following her PCP regarding this.  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  She endorses increased fatigue, brain fog, intermittent headaches and worsening shortness of breath.  She does note a few episodes of fast and sharp chest pain that resolves within a few seconds, denies occurrence with exertion.  She also notes history of intermittent cramping in the legs, notes improvement with taking a magnesium supplement.  Patient also notes problems with  fast heart rates however reports that she is intermittently missing doses of her metoprolol  as she is unsure of which medication she takes as a result of brain fog.  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  She denies increased lower extremity edema, orthopnea, PND, presyncope or syncope.  Labwork independently reviewed: 06/09/2024: Hemoglobin 14.8, hematocrit 45.8, creatinine 0.89, sodium 147, potassium 5.1, AST 56, ALT 76 ROS: .   Today she denies lower extremity edema, melena, hematuria, hemoptysis, diaphoresis, weakness, presyncope, syncope, orthopnea, and PND.  All other systems are reviewed and otherwise negative. Studies Reviewed: SABRA   EKG:  EKG is ordered today, personally reviewed, demonstrating  EKG Interpretation Date/Time:  Friday June 20 2024 11:47:48 EDT Ventricular Rate:  119 PR Interval:  140 QRS Duration:  74 QT Interval:  308 QTC Calculation: 433 R Axis:   -9  Text Interpretation: Sinus tachycardia Nonspecific T wave abnormality Confirmed by Gwynn Crossley [phone]) on 06/20/2024 9:09:14 PM   CV Studies: Cardiac studies reviewed are outlined and summarized above. Otherwise please see EMR for full report. Cardiac Studies & Procedures   ______________________________________________________________________________________________   STRESS TESTS  PCV MYOCARDIAL PERFUSION WO LEXISCAN 05/23/2021  Interpretation Summary Exercise Sestamibi stress test 05/23/2021: Exercise nuclear stress test was performed using Bruce protocol. Patient reached 6.2 METS, and 88% of age predicted maximum heart rate. Exercise capacity was low. No chest pain reported. Heart rate and hemodynamic response were normal. Stress EKG revealed no ischemic  changes. Normal myocardial perfusion. Stress LVEF calculated 48%, although visually appears normal. Low risk study.   ECHOCARDIOGRAM  PCV  ECHOCARDIOGRAM COMPLETE 05/13/2021  Narrative Echocardiogram 05/13/2021: Left ventricle cavity is normal in size. Mild concentric hypertrophy of the left ventricle. Normal global wall motion. Normal LV systolic function with visual EF 50-55%. Normal diastolic filling pattern. Mild (Grade I) mitral regurgitation. Unlike study in 2015, PFO not appreciated.      CT SCANS  CT CORONARY FRACTIONAL FLOW RESERVE DATA PREP 06/27/2021  Narrative EXAM: CT FFR ANALYSIS  CLINICAL DATA:  Chest pain, nonspecific  FINDINGS: FFRct analysis was performed on the original cardiac CT angiogram dataset. Diagrammatic representation of the FFRct analysis is provided in a separate PDF document in PACS. This dictation was created using the PDF document and an interactive 3D model of the results. 3D model is not available in the EMR/PACS. Normal FFR range is >0.80. Indeterminate (grey) zone is 0.76-0.80.  1. Left Main: FFR = 0.98  2. LAD: Proximal FFR = 0.96, mid FFR = 0.94, distal FFR = 0.91 3. LCX: Proximal FFR = 0.98, distal FFR = 0.97 4. RCA: Proximal FFR = 0.99, mid FFR =0.96, distal FFR = 0.96  IMPRESSION: 1.  CT FFR analysis showed no significant stenosis.  RECOMMENDATIONS: Goal directed medical therapy and aggressive risk factor modification for secondary prevention of coronary artery disease.   Electronically Signed By: Madonna Large D.O. On: 07/03/2021 22:45   CT SCANS  CT CORONARY MORPH W/CTA COR W/SCORE 06/24/2021  Addendum 06/26/2021  6:07 PM ADDENDUM REPORT: 06/26/2021 18:05  HISTORY: Chest pain/anginal equiv, ECGs and troponins normal  EXAM: Cardiac/Coronary  CT  TECHNIQUE: The patient was scanned on a [employer].  PROTOCOL: A 120 kV prospective scan was triggered in the descending thoracic aorta at 111 HU's. Axial non-contrast 3 mm slices were carried out through the heart. The data set was analyzed on a dedicated work station and scored using the Agatson  method. Gantry rotation speed was 250 msecs and collimation was .6 mm. No IV beta blockade but 0.8 mg of sl NTG was given. The 3D data set was reconstructed in 5% intervals of the 67-82 % of the R-R cycle. Diastolic phases were analyzed on a dedicated work station using MPR, MIP and VRT modes. The patient received OMNIPAQUE  IOHEXOL  350 MG/ML SOLN of contrast.  FINDINGS: Image quality: Average.  Artifact: Moderate (respiratory).  Coronary artery calcification score:  Left main: 0  Left anterior descending artery: 118  Left circumflex artery: 29.3  Right coronary artery: 0.305  Total coronary calcium  score of 148, places the patient at the 97th percentile for age and sex matched control.  Coronary arteries: Normal coronary origins.  Right dominance.  Left Main Coronary Artery: The left main is a normal caliber vessel with a normal take off from the left coronary cusp that bifurcates to form a left anterior descending artery and a left circumflex artery. There is no plaque or stenosis.  Left Anterior Descending Coronary Artery: Normal caliber vessel, wraps the apex, gives off 2 patent diagonal branches. Moderate stenosis (50-69%) due to calcified plaque within the ostial/proximal LAD (closer to 50%). Mid to distal LAD is patent without evidence of any significant plaque or stenosis. First diagonal branch is patent with minimal calcified plaque. Second diagonal branch is patent.  Left Circumflex Artery: Normal caliber vessel, non-dominant, travels within the atrioventricular groove, gives off 1 patent obtuse marginal branches. The LCX is patent with no evidence of plaque  or stenosis. Mild stenosis (25-49%) due to calcified plaque at ostial OM1 otherwise vessel is patent.  Right Coronary Artery: The RCA is dominant with normal take off from the right coronary cusp. The RCA terminates as a PDA and right posterolateral branch. Minimal stenosis (0-24%) due to mixed  plaque within proximal RCA, mid to distal RCA is patent without evidence of plaque or stenosis.  Left Atrium: Grossly normal in size with no left atrial appendage filling defect.  Left Ventricle: Grossly normal in size. There are no stigmata of prior infarction. There is no abnormal filling defect.  Pulmonary arteries: Normal in size without proximal filling defect.  Pulmonary veins: Normal pulmonary venous drainage.  Aorta: Normal size, 25.3 mm at the mid ascending aorta (level of the PA bifurcation) measured double oblique. No dissection.  Pericardium: Normal thickness with no significant effusion or calcium  present.  Cardiac valves: The aortic valve is trileaflet without significant calcification. The mitral valve is normal structure without significant calcification.  Extra-cardiac findings: See attached radiology report for non-cardiac structures.  IMPRESSION: 1. Total coronary calcium  score of 148. This was 97th percentile for age and sex matched control.  2. Normal coronary origin with right dominance.  3. CAD-RADS = 3.  Left Main: Patent  LAD: Ostial-proximal (50-69%) stenosis due to calcified plaque (closer to 50% stenosis, accuracy limited due to blooming artifact). Mid-distal LAD patent.  LCX:  Patent.  OM1: Ostial-proximal (25-49%) stenosis due to calcified plaque.  RCA: Proximal (0-24%) stenosis due to mixed plaque otherwise the vessel is patent.  4. Study is sent for CT-FFR to further evaluate ostial / proximal LAD stenosis. Report will be performed and reported separately.  RECOMMENDATIONS:  Consider symptom-guided anti-ischemic pharmacotherapy as well as risk factor modification per guideline directed care.  Additional analysis with CT FFR will be submitted.   Electronically Signed By: Madonna Large D.O. On: 06/26/2021 18:05  Narrative EXAM: OVER-READ INTERPRETATION  CT CHEST  The following report is an over-read performed by  radiologist Dr. Toribio Aye of [facility] on 06/24/2021. This over-read does not include interpretation of cardiac or coronary anatomy or pathology. The coronary calcium  score/coronary CTA interpretation by the cardiologist is attached.  COMPARISON:  None.  FINDINGS: Within the visualized portions of the thorax there are no suspicious appearing pulmonary nodules or masses, there is no acute consolidative airspace disease, no pleural effusions, no pneumothorax and no lymphadenopathy. Visualized portions of the upper abdomen demonstrates severe diffuse low attenuation throughout the visualized hepatic parenchyma, indicative of severe hepatic steatosis. There are no aggressive appearing lytic or blastic lesions noted in the visualized portions of the skeleton.  IMPRESSION: 1. Severe hepatic steatosis.  Electronically Signed: By: Toribio Aye M.D. On: 06/24/2021 11:57     ______________________________________________________________________________________________       Current Reported Medications:.    Current Meds  Medication Sig   clorazepate  (TRANXENE ) 7.5 MG tablet TAKE 1 CAPSULE  DAILY MONDAY THRU THURSDAY AND IF NEEDED ON OTHER DAYS.   ezetimibe  (ZETIA ) 10 MG tablet Take 1 tablet (10 mg total) by mouth daily after supper.   IBUPROFEN PO Take 600 mg by mouth daily as needed (takes 1-3 x week. (600mg  tablet) pain).   Multiple Vitamins-Minerals (MULTIVITAMIN PO) Take 1 tablet by mouth daily.   nitroGLYCERIN  (NITROSTAT ) 0.4 MG SL tablet Place 1 tablet (0.4 mg total) under the tongue every 5 (five) minutes as needed for up to 25 days for chest pain.   olmesartan  (BENICAR ) 20 MG tablet Take 1  tablet (20 mg total) by mouth daily.   omeprazole (PRILOSEC) 20 MG capsule Take 20 mg by mouth daily.   primidone  (MYSOLINE ) 50 MG tablet TAKE 1 TABLET BY MOUTH  DAILY AND AN ADDITIONAL 1/2 TABLET DAILY AS NEEDED   REPATHA SURECLICK 140 MG/ML SOAJ 1 ML SUBCUTANEOUS  EVERY 2 WEEKS 30 DAYS   saccharomyces boulardii (FLORASTOR) 250 MG capsule Take 250 mg by mouth 2 (two) times daily.   venlafaxine XR (EFFEXOR-XR) 75 MG 24 hr capsule TAKE 1 CAPSULE BY MOUTH EVERY DAY WITH FOOD; Duration: 90   [DISCONTINUED] metoprolol  tartrate (LOPRESSOR ) 50 MG tablet Take 1 tablet (50 mg total) by mouth 2 (two) times daily.   [DISCONTINUED] olmesartan -hydrochlorothiazide (BENICAR  HCT) 20-12.5 MG tablet Take 1 tablet by mouth every morning. Hold on weekends    Physical Exam:    VS:  BP 106/66   Pulse 94   Ht 5' 2 (1.575 m)   Wt 209 lb 9.6 oz (95.1 kg)   LMP 07/15/2014 (Within Days)   SpO2 99%   BMI 38.34 kg/m    Wt Readings from Last 3 Encounters:  06/20/24 209 lb 9.6 oz (95.1 kg)  05/14/23 205 lb 12.8 oz (93.4 kg)  03/09/23 200 lb (90.7 kg)    GEN: Well nourished, well developed in no acute distress NECK: No JVD; No carotid bruits CARDIAC: RRR, no murmurs, rubs, gallops RESPIRATORY:  Clear to auscultation without rales, wheezing or rhonchi  ABDOMEN: Soft, non-tender, non-distended EXTREMITIES:  No edema; No acute deformity     Asessement and Plan:.    CAD: Coronary CTA in 06/2021 indicated total coronary calcium  score of 148, this was 97th percentile for age and sex matched control ostial to proximal LAD with 50 to 69% stenosis due to calcified plaques, noted to be closer to 50%, accuracy was limited due to blooming artifact, CT FFR analysis showed no significant stenosis. Today she reports concerns regarding increased fatigue, shortness of breath and chest pain that she notes is infrequent and is sharp and fleeting.  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  Discussed ischemic evaluation, patient would like to proceed, notes that she would be unable to complete a treadmill stress test, is  agreeable to nuclear stress test with Lexiscan.  Will also check echocardiogram given dyspnea on exertion.  Reviewed ED precautions. Informed Consent   Shared Decision Making/Informed Consent The risks [chest pain, shortness of breath, cardiac arrhythmias, dizziness, blood pressure fluctuations, myocardial infarction, stroke/transient ischemic attack, nausea, vomiting, allergic reaction, radiation exposure, metallic taste sensation and life-threatening complications (estimated to be 1 in 10,000)], benefits (risk stratification, diagnosing coronary artery disease, treatment guidance) and alternatives of a nuclear stress test were discussed in detail with Ms. Delores and she agrees to proceed.     Hyperlipidemia: Last profile on 06/09/2024 indicated total cholesterol 153, HDL 60, triglycerides 261 and LDL 53.  Continue simvastatin  40 mg daily and Zetia  10 mg daily.  Hypertension: Blood pressure today 104/66, on recheck was 106/66. 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  Will continue patient on olmesartan  20 mg daily and discontinue her hydrochlorothiazide.  Palpitations: Patient reports that her heart rate has always been elevated, notes some  increased palpitations.  She notes that she has been intermittently taking her metoprolol  as with worsening brain fog she has been intermittently to take her medication.  She does not believe that she took her metoprolol  this morning, will resume taking the medication, refill provided.  Fatty liver: Patient reports that recent ultrasound indicates worsening of fatty liver.  She questions if some of her fatigue and brain fog is as a result of this.  She will continue to follow with her PCP regarding this.   Disposition: F/u with Dr. Ganji or Keegan Bensch, NP in 6-8 weeks.   Signed, Addie Alonge D Connie Hilgert, NP

## 2024-06-20 ENCOUNTER — Encounter: Payer: Self-pay | Admitting: Cardiology

## 2024-06-20 ENCOUNTER — Ambulatory Visit: Attending: Cardiology | Admitting: Cardiology

## 2024-06-20 VITALS — BP 106/66 | HR 94 | Ht 62.0 in | Wt 209.6 lb

## 2024-06-20 DIAGNOSIS — I10 Essential (primary) hypertension: Secondary | ICD-10-CM

## 2024-06-20 DIAGNOSIS — I25118 Atherosclerotic heart disease of native coronary artery with other forms of angina pectoris: Secondary | ICD-10-CM | POA: Diagnosis not present

## 2024-06-20 DIAGNOSIS — E7800 Pure hypercholesterolemia, unspecified: Secondary | ICD-10-CM

## 2024-06-20 DIAGNOSIS — R931 Abnormal findings on diagnostic imaging of heart and coronary circulation: Secondary | ICD-10-CM | POA: Diagnosis not present

## 2024-06-20 DIAGNOSIS — R5383 Other fatigue: Secondary | ICD-10-CM

## 2024-06-20 DIAGNOSIS — R0602 Shortness of breath: Secondary | ICD-10-CM

## 2024-06-20 MED ORDER — METOPROLOL TARTRATE 50 MG PO TABS: 50.0000 mg | ORAL_TABLET | Freq: Two times a day (BID) | ORAL | 3 refills | Status: AC

## 2024-06-20 MED ORDER — OLMESARTAN MEDOXOMIL 20 MG PO TABS: 20.0000 mg | ORAL_TABLET | Freq: Every day | ORAL | 3 refills | Status: AC

## 2024-06-20 NOTE — Patient Instructions (Addendum)
 Medication Instructions:   START TAKING : OLMESARTAN  20 MG ONCE A DAY    *If you need a refill on your cardiac medications before your next appointment, please call your pharmacy*    Lab Work: NONE ORDERED  TODAY     If you have labs (blood work) drawn today and your tests are completely normal, you will receive your results only by: MyChart Message (if you have MyChart) OR A paper copy in the mail If you have any lab test that is abnormal or we need to change your treatment, we will call you to review the results.    Testing/Procedures: Your physician has requested that you have an echocardiogram. Echocardiography is a painless test that uses sound waves to create images of your heart. It provides your doctor with information about the size and shape of your heart and how well your heart's chambers and valves are working. This procedure takes approximately one hour. There are no restrictions for this procedure. Please do NOT wear cologne, perfume, aftershave, or lotions (deodorant is allowed). Please arrive 15 minutes prior to your appointment time.  Please note: We ask at that you not bring children with you during ultrasound (echo/ vascular) testing. Due to room size and safety concerns, children are not allowed in the ultrasound rooms during exams. Our front office staff cannot provide observation of children in our lobby area while testing is being conducted. An adult accompanying a patient to their appointment will only be allowed in the ultrasound room at the discretion of the ultrasound technician under special circumstances. We apologize for any inconvenience.  Your physician has requested that you have a lexiscan myoview. For further information please visit [email or web address] Please follow instruction sheet, as given.     Follow-Up: At [facility], you and your health needs are our priority.  As part of our continuing mission to provide you with exceptional  heart care, our providers are all part of one team.  This team includes your primary Cardiologist (physician) and Advanced Practice Providers or APPs (Physician Assistants and Nurse Practitioners) who all work together to provide you with the care you need, when you need it.    Your next appointment:  6 week(s)   Provider: of Dr Margaretann  /  Kaitlyn West     We recommend signing up for the patient portal called MyChart.  Sign up information is provided on this After Visit Summary.  MyChart is used to connect with patients for Virtual Visits (Telemedicine).  Patients are able to view lab/test results, encounter notes, upcoming appointments, etc.  Non-urgent messages can be sent to your provider as well.   To learn more about what you can do with MyChart, go to [email or web address].   Other Instructions

## 2024-06-21 ENCOUNTER — Encounter: Payer: Self-pay | Admitting: Cardiology

## 2024-07-07 ENCOUNTER — Other Ambulatory Visit (HOSPITAL_COMMUNITY): Payer: Self-pay | Admitting: Radiology

## 2024-07-07 DIAGNOSIS — R062 Wheezing: Secondary | ICD-10-CM

## 2024-07-08 ENCOUNTER — Encounter (HOSPITAL_COMMUNITY): Payer: Self-pay

## 2024-07-11 ENCOUNTER — Ambulatory Visit (HOSPITAL_COMMUNITY)
Admission: RE | Admit: 2024-07-11 | Discharge: 2024-07-11 | Disposition: A | Discharge status: Home / Self Care | Source: Ambulatory Visit | Attending: Registered Nurse | Admitting: Registered Nurse

## 2024-07-11 DIAGNOSIS — R062 Wheezing: Secondary | ICD-10-CM | POA: Insufficient documentation

## 2024-07-11 LAB — PULMONARY FUNCTION TEST
DL/VA % pred: 104 %
DL/VA: 4.5 ml/min/mmHg/L
DLCO unc % pred: 102 %
DLCO unc: 19.5 ml/min/mmHg
FEF 25-75 Post: 3.25 L/s
FEF 25-75 Pre: 3.27 L/s
FEF2575-%Change-Post: 0 %
FEF2575-%Pred-Post: 140 %
FEF2575-%Pred-Pre: 140 %
FEV1-%Change-Post: 0 %
FEV1-%Pred-Post: 110 %
FEV1-%Pred-Pre: 109 %
FEV1-Post: 2.68 L
FEV1-Pre: 2.66 L
FEV1FVC-%Change-Post: 0 %
FEV1FVC-%Pred-Pre: 107 %
FEV6-%Change-Post: 0 %
FEV6-%Pred-Post: 104 %
FEV6-%Pred-Pre: 104 %
FEV6-Post: 3.15 L
FEV6-Pre: 3.14 L
FEV6FVC-%Pred-Post: 103 %
FEV6FVC-%Pred-Pre: 103 %
FVC-%Change-Post: 0 %
FVC-%Pred-Post: 100 %
FVC-%Pred-Pre: 100 %
FVC-Post: 3.15 L
FVC-Pre: 3.14 L
Post FEV1/FVC ratio: 85 %
Post FEV6/FVC ratio: 100 %
Pre FEV1/FVC ratio: 85 %
Pre FEV6/FVC Ratio: 100 %
RV % pred: 84 %
RV: 1.55 L
TLC % pred: 101 %
TLC: 4.84 L

## 2024-07-11 MED ORDER — ALBUTEROL SULFATE (2.5 MG/3ML) 0.083% IN NEBU
2.5000 mg | INHALATION_SOLUTION | Freq: Once | RESPIRATORY_TRACT | Status: AC
  Administered 2024-07-11: 2.5 mg via RESPIRATORY_TRACT

## 2024-07-16 ENCOUNTER — Other Ambulatory Visit: Payer: Self-pay | Admitting: Registered Nurse

## 2024-07-16 DIAGNOSIS — Z1231 Encounter for screening mammogram for malignant neoplasm of breast: Secondary | ICD-10-CM

## 2024-07-23 ENCOUNTER — Other Ambulatory Visit: Payer: Self-pay | Admitting: Cardiology

## 2024-07-23 DIAGNOSIS — R0602 Shortness of breath: Secondary | ICD-10-CM

## 2024-07-28 ENCOUNTER — Telehealth (HOSPITAL_COMMUNITY): Payer: Self-pay

## 2024-07-28 NOTE — Telephone Encounter (Signed)
 Spoke with the patient, detailed instructions given. S.Sueo Cullen CCT

## 2024-07-29 ENCOUNTER — Ambulatory Visit (HOSPITAL_COMMUNITY)
Admission: RE | Admit: 2024-07-29 | Discharge: 2024-07-29 | Disposition: A | Discharge status: Home / Self Care | Source: Ambulatory Visit | Attending: Cardiology | Admitting: Cardiology

## 2024-07-29 ENCOUNTER — Ambulatory Visit (HOSPITAL_BASED_OUTPATIENT_CLINIC_OR_DEPARTMENT_OTHER)
Admission: RE | Admit: 2024-07-29 | Discharge: 2024-07-29 | Disposition: A | Discharge status: Home / Self Care | Source: Ambulatory Visit | Attending: Cardiology | Admitting: Cardiology

## 2024-07-29 DIAGNOSIS — R0600 Dyspnea, unspecified: Secondary | ICD-10-CM | POA: Diagnosis not present

## 2024-07-29 DIAGNOSIS — R0602 Shortness of breath: Secondary | ICD-10-CM

## 2024-07-29 LAB — MYOCARDIAL PERFUSION IMAGING
LV dias vol: 78 mL (ref 46–106)
LV sys vol: 18 mL (ref 3.8–5.2)
Nuc Stress EF: 77 %
Peak HR: 90 {beats}/min
Rest HR: 71 {beats}/min
Rest Nuclear Isotope Dose: 10.4 mCi
SDS: 2
SRS: 0
SSS: 2
ST Depression (mm): 0 mm
Stress Nuclear Isotope Dose: 30 mCi
TID: 0.94

## 2024-07-29 LAB — ECHOCARDIOGRAM COMPLETE
Area-P 1/2: 3.78 cm2
S' Lateral: 3.3 cm

## 2024-07-29 MED ORDER — TECHNETIUM TC 99M TETROFOSMIN IV KIT
30.0000 | PACK | Freq: Once | INTRAVENOUS | Status: AC | PRN
  Administered 2024-07-29: 30 via INTRAVENOUS

## 2024-07-29 MED ORDER — REGADENOSON 0.4 MG/5ML IV SOLN
INTRAVENOUS | Status: AC
  Filled 2024-07-29: qty 5

## 2024-07-29 MED ORDER — PERFLUTREN LIPID MICROSPHERE
1.0000 mL | INTRAVENOUS | Status: AC | PRN
  Administered 2024-07-29: 1 mL via INTRAVENOUS

## 2024-07-29 MED ORDER — TECHNETIUM TC 99M TETROFOSMIN IV KIT
10.4000 | PACK | Freq: Once | INTRAVENOUS | Status: AC | PRN
  Administered 2024-07-29: 10.4 via INTRAVENOUS

## 2024-07-29 MED ORDER — REGADENOSON 0.4 MG/5ML IV SOLN
0.4000 mg | Freq: Once | INTRAVENOUS | Status: AC
  Administered 2024-07-29: 0.4 mg via INTRAVENOUS

## 2024-07-31 NOTE — H&P (View-Only) (Signed)
 Cardiology Office Note    Date:  08/01/2024  ID:  ALLISSON SCHINDEL, DOB September 07, 1966, MRN [id number] PCP:  Royden Ronal Czar, FNP  Cardiologist:  Gordy Bergamo, MD  Electrophysiologist:  None   Chief Complaint: Follow up for fatigue and shortness of breath   History of Present Illness: .   Jamie Trujillo is a 58 y.o. female with visit-pertinent history of moderate coronary disease by coronary CTA in 06/2021, hypertension, hyperlipidemia, anxiety and benign essential tremor.   Patient evaluated by Dr. Bergamo initially in 2022 with reports of chest pain.  Echocardiogram in 05/2021 indicated normal LV systolic function with EF 50 to 55%, normal diastolic filling pattern, mild concentric LVH.  MPI in 05/2021 was normal and low risk.  Coronary CTA in 06/2021 indicated total coronary calcium  score of 148, this was 97th percentile for age and sex matched control ostial to proximal LAD with 50 to 69% stenosis due to calcified plaques, noted to be closer to 50%, accuracy was limited due to blooming artifact, CT FFR analysis showed no significant stenosis.    Patient was last seen in clinic by Dr. Bergamo in 03/2023, noted to feel the best she had in a a while.  Patient denied any further chest pain, and reported that her blood pressure had been very well-controlled.   Patient was last seen in clinic on 06/20/2024 for follow-up and concerns regarding fatigue.  She reported that she had recently been notified that her fatty liver disease had worsened, was following with her PCP regarding this.  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  She did note a few episodes of fast and sharp chest pain that resolved within a few seconds, denied reoccurrence with exertion.  Patient was noted to be tachycardic at visit however reported she been missing doses of her metoprolol .  Echocardiogram and  newly her stress test was ordered for further evaluation.  Nuclear stress test on 07/29/2024 indicated findings consistent with ischemia, there is a small defect with mild reduction in uptake present in the apical septal location that was reversible, normal wall motion the defect area consistent with ischemia, nuclear stress EF 77%, moderate coronary calcifications were present.  Dr. Bergamo reviewed patient's echocardiogram and nuclear stress test results, he felt that her stress test was likely more intermediate risk, noting history of moderate stenosis in the LAD, he read LVEF at 50 to 55% with recommendation for possible cardiac catheterization.  Today she presents for follow-up.  She reports that she is doing okay, continues to note increased fatigue and shortness of breath.  She reports occasional episodes of a sharp and fast chest discomfort that resolves after a few seconds, not associate with exertion.  Her palpitations have improved with consistent usage of metoprolol .  She denies any increased lower extremity edema, orthopnea or PND.  Reviewed patient's nuclear stress test and echocardiogram results with her, she is in agreement with proceeding with cardiac catheterization. ROS: .   Today she denies lower extremity edema, melena, hematuria, hemoptysis, diaphoresis, presyncope, syncope, orthopnea, and PND.  All other systems are reviewed and otherwise negative. Studies Reviewed: Jamie Trujillo   EKG:  EKG is ordered today, personally reviewed, demonstrating  EKG Interpretation Date/Time:  Friday August 01 2024 10:27:25 EDT Ventricular Rate:  87 PR Interval:  154 QRS Duration:  72 QT Interval:  362 QTC Calculation: 435 R Axis:   -  23  Text Interpretation: Normal sinus rhythm Minimal voltage criteria for LVH, may be normal variant ( R in aVL ) When compared with ECG of 20-Jun-2024 11:47, No significant change was found Heart rate has improved compared to prior ECG Confirmed by Ryun Velez [phone]) on  08/01/2024 11:01:08 AM   CV Studies: Cardiac studies reviewed are outlined and summarized above. Otherwise please see EMR for full report. Cardiac Studies & Procedures   ______________________________________________________________________________________________   STRESS TESTS  MYOCARDIAL PERFUSION IMAGING 07/29/2024  Interpretation Summary   Findings are consistent with ischemia. The study is low risk.   The ECG was not diagnostic due to pharmacologic protocol.   LV perfusion is abnormal. There is evidence of ischemia. Defect 1: There is a small defect with mild reduction in uptake present in the apical septal location(s) that is reversible. There is normal wall motion in the defect area. Consistent with ischemia.   Left ventricular function is normal. Nuclear stress EF: 77%. The left ventricular ejection fraction is hyperdynamic (>65%). End diastolic cavity size is normal. End systolic cavity size is normal.   CT images were obtained for attenuation correction and were examined for the presence of coronary calcium  when appropriate.   Coronary calcium  was present on the attenuation correction CT images. Moderate coronary calcifications were present. Coronary calcifications were present in the left anterior descending artery distribution(s).   Prior study available for comparison from 05/23/2021. There are changes compared to prior study which appear to be new.  Very small area at the apical septum with mild perfusion defect with stress, seen on both corrected and uncorrected images. Suggests small focal area of ischemia, but given mild severity/small area, study is overall low risk.   ECHOCARDIOGRAM  ECHOCARDIOGRAM COMPLETE 07/29/2024  Narrative ECHOCARDIOGRAM REPORT    Patient Name:   Jamie Trujillo Date of Exam: 07/29/2024 Medical Rec #:  [id number]     Height:       62.0 in Accession #:    [id number]    Weight:       209.6 lb Date of Birth:  09-Jan-1966     BSA:          1.950  m Patient Age:    58 years      BP:           106/66 mmHg Patient Gender: F             HR:           77 bpm. Exam Location:  [address]  Procedure: 2D Echo, Cardiac Doppler, Color Doppler and Intracardiac Opacification Agent (Both Spectral and Color Flow Doppler were utilized during procedure).  Indications:    R06.00 Dyspnea  History:        Patient has prior history of Echocardiogram examinations, most recent 05/13/2021. Signs/Symptoms:Murmur; Risk Factors:Hypertension and Dyslipidemia.  Sonographer:    Carl Coma RDCS Referring Phys: [id number] Alexia Dinger D Dorissa Stinnette  IMPRESSIONS   1. Left ventricular ejection fraction, by estimation, is 45 to 50%. Left ventricular ejection fraction by PLAX is 57 %. The left ventricle has mildly decreased function. The left ventricle demonstrates global hypokinesis. Left ventricular diastolic parameters are consistent with Grade I diastolic dysfunction (impaired relaxation). 2. Right ventricular systolic function is normal. The right ventricular size is normal. 3. The mitral valve is normal in structure. No evidence of mitral valve regurgitation. No evidence of mitral stenosis. 4. The aortic valve is tricuspid. Aortic valve regurgitation is not visualized. No aortic stenosis is present.  5. The inferior vena cava is normal in size with greater than 50% respiratory variability, suggesting right atrial pressure of 3 mmHg.  FINDINGS Left Ventricle: Left ventricular ejection fraction, by estimation, is 45 to 50%. Left ventricular ejection fraction by PLAX is 57 %. The left ventricle has mildly decreased function. The left ventricle demonstrates global hypokinesis. Definity  contrast agent was given IV to delineate the left ventricular endocardial borders. The left ventricular internal cavity size was normal in size. There is no left ventricular hypertrophy. Left ventricular diastolic parameters are consistent with Grade I diastolic dysfunction  (impaired relaxation).  Right Ventricle: The right ventricular size is normal. No increase in right ventricular wall thickness. Right ventricular systolic function is normal.  Left Atrium: Left atrial size was normal in size.  Right Atrium: Right atrial size was normal in size.  Pericardium: There is no evidence of pericardial effusion.  Mitral Valve: The mitral valve is normal in structure. No evidence of mitral valve regurgitation. No evidence of mitral valve stenosis.  Tricuspid Valve: The tricuspid valve is normal in structure. Tricuspid valve regurgitation is mild . No evidence of tricuspid stenosis.  Aortic Valve: The aortic valve is tricuspid. Aortic valve regurgitation is not visualized. No aortic stenosis is present.  Pulmonic Valve: The pulmonic valve was normal in structure. Pulmonic valve regurgitation is not visualized. No evidence of pulmonic stenosis.  Aorta: The aortic root is normal in size and structure.  Venous: The inferior vena cava is normal in size with greater than 50% respiratory variability, suggesting right atrial pressure of 3 mmHg.  IAS/Shunts: No atrial level shunt detected by color flow Doppler.   LEFT VENTRICLE PLAX 2D LV EF:         Left            Diastology ventricular     LV e' medial:    6.20 cm/s ejection        LV E/e' medial:  9.2 fraction by     LV e' lateral:   8.34 cm/s PLAX is 57      LV E/e' lateral: 6.8 %. LVIDd:         4.70 cm LVIDs:         3.30 cm LV PW:         0.90 cm LV IVS:        0.60 cm LVOT diam:     1.90 cm LV SV:         44 LV SV Index:   23 LVOT Area:     2.84 cm   RIGHT VENTRICLE RV Basal diam:  3.40 cm RV S prime:     9.28 cm/s TAPSE (M-mode): 1.9 cm  LEFT ATRIUM             Index        RIGHT ATRIUM           Index LA diam:        3.80 cm 1.95 cm/m   RA Area:     13.10 cm LA Vol (A2C):   23.2 ml 11.90 ml/m  RA Volume:   36.70 ml  18.82 ml/m LA Vol (A4C):   35.7 ml 18.31 ml/m LA Biplane Vol: 28.9  ml 14.82 ml/m AORTIC VALVE LVOT Vmax:   73.17 cm/s LVOT Vmean:  49.967 cm/s LVOT VTI:    0.156 m  AORTA Ao Root diam: 3.00 cm Ao Asc diam:  2.90 cm  MITRAL VALVE  TRICUSPID VALVE MV Area (PHT): 3.78 cm    TR Peak grad:   21.2 mmHg MV Decel Time: 201 msec    TR Vmax:        230.00 cm/s MV E velocity: 56.85 cm/s MV A velocity: 73.90 cm/s  SHUNTS MV E/A ratio:  0.77        Systemic VTI:  0.16 m Systemic Diam: 1.90 cm  Jamie Scarce MD Electronically signed by Jamie Scarce MD Signature Date/Time: 07/29/2024/12:58:09 PM    Final      CT SCANS  CT CORONARY FRACTIONAL FLOW RESERVE DATA PREP 06/27/2021  Narrative EXAM: CT FFR ANALYSIS  CLINICAL DATA:  Chest pain, nonspecific  FINDINGS: FFRct analysis was performed on the original cardiac CT angiogram dataset. Diagrammatic representation of the FFRct analysis is provided in a separate PDF document in PACS. This dictation was created using the PDF document and an interactive 3D model of the results. 3D model is not available in the EMR/PACS. Normal FFR range is >0.80. Indeterminate (grey) zone is 0.76-0.80.  1. Left Main: FFR = 0.98  2. LAD: Proximal FFR = 0.96, mid FFR = 0.94, distal FFR = 0.91 3. LCX: Proximal FFR = 0.98, distal FFR = 0.97 4. RCA: Proximal FFR = 0.99, mid FFR =0.96, distal FFR = 0.96  IMPRESSION: 1.  CT FFR analysis showed no significant stenosis.  RECOMMENDATIONS: Goal directed medical therapy and aggressive risk factor modification for secondary prevention of coronary artery disease.   Electronically Signed By: Madonna Large D.O. On: 07/03/2021 22:45   CT SCANS  CT CORONARY MORPH W/CTA COR W/SCORE 06/24/2021  Addendum 06/26/2021  6:07 PM ADDENDUM REPORT: 06/26/2021 18:05  HISTORY: Chest pain/anginal equiv, ECGs and troponins normal  EXAM: Cardiac/Coronary  CT  TECHNIQUE: The patient was scanned on a [employer].  PROTOCOL: A 120 kV prospective  scan was triggered in the descending thoracic aorta at 111 HU's. Axial non-contrast 3 mm slices were carried out through the heart. The data set was analyzed on a dedicated work station and scored using the Agatson method. Gantry rotation speed was 250 msecs and collimation was .6 mm. No IV beta blockade but 0.8 mg of sl NTG was given. The 3D data set was reconstructed in 5% intervals of the 67-82 % of the R-R cycle. Diastolic phases were analyzed on a dedicated work station using MPR, MIP and VRT modes. The patient received 100mL OMNIPAQUE  IOHEXOL  350 MG/ML SOLN of contrast.  FINDINGS: Image quality: Average.  Artifact: Moderate (respiratory).  Coronary artery calcification score:  Left main: 0  Left anterior descending artery: 118  Left circumflex artery: 29.3  Right coronary artery: 0.305  Total coronary calcium  score of 148, places the patient at the 97th percentile for age and sex matched control.  Coronary arteries: Normal coronary origins.  Right dominance.  Left Main Coronary Artery: The left main is a normal caliber vessel with a normal take off from the left coronary cusp that bifurcates to form a left anterior descending artery and a left circumflex artery. There is no plaque or stenosis.  Left Anterior Descending Coronary Artery: Normal caliber vessel, wraps the apex, gives off 2 patent diagonal branches. Moderate stenosis (50-69%) due to calcified plaque within the ostial/proximal LAD (closer to 50%). Mid to distal LAD is patent without evidence of any significant plaque or stenosis. First diagonal branch is patent with minimal calcified plaque. Second diagonal branch is patent.  Left Circumflex Artery: Normal caliber vessel, non-dominant, travels within the atrioventricular  groove, gives off 1 patent obtuse marginal branches. The LCX is patent with no evidence of plaque or stenosis. Mild stenosis (25-49%) due to calcified plaque at ostial OM1 otherwise  vessel is patent.  Right Coronary Artery: The RCA is dominant with normal take off from the right coronary cusp. The RCA terminates as a PDA and right posterolateral branch. Minimal stenosis (0-24%) due to mixed plaque within proximal RCA, mid to distal RCA is patent without evidence of plaque or stenosis.  Left Atrium: Grossly normal in size with no left atrial appendage filling defect.  Left Ventricle: Grossly normal in size. There are no stigmata of prior infarction. There is no abnormal filling defect.  Pulmonary arteries: Normal in size without proximal filling defect.  Pulmonary veins: Normal pulmonary venous drainage.  Aorta: Normal size, 25.3 mm at the mid ascending aorta (level of the PA bifurcation) measured double oblique. No dissection.  Pericardium: Normal thickness with no significant effusion or calcium  present.  Cardiac valves: The aortic valve is trileaflet without significant calcification. The mitral valve is normal structure without significant calcification.  Extra-cardiac findings: See attached radiology report for non-cardiac structures.  IMPRESSION: 1. Total coronary calcium  score of 148. This was 97th percentile for age and sex matched control.  2. Normal coronary origin with right dominance.  3. CAD-RADS = 3.  Left Main: Patent  LAD: Ostial-proximal (50-69%) stenosis due to calcified plaque (closer to 50% stenosis, accuracy limited due to blooming artifact). Mid-distal LAD patent.  LCX:  Patent.  OM1: Ostial-proximal (25-49%) stenosis due to calcified plaque.  RCA: Proximal (0-24%) stenosis due to mixed plaque otherwise the vessel is patent.  4. Study is sent for CT-FFR to further evaluate ostial / proximal LAD stenosis. Report will be performed and reported separately.  RECOMMENDATIONS:  Consider symptom-guided anti-ischemic pharmacotherapy as well as risk factor modification per guideline directed care.  Additional analysis with  CT FFR will be submitted.   Electronically Signed By: Madonna Large D.O. On: 06/26/2021 18:05  Narrative EXAM: OVER-READ INTERPRETATION  CT CHEST  The following report is an over-read performed by radiologist Dr. Toribio Aye of [facility] on 06/24/2021. This over-read does not include interpretation of cardiac or coronary anatomy or pathology. The coronary calcium  score/coronary CTA interpretation by the cardiologist is attached.  COMPARISON:  None.  FINDINGS: Within the visualized portions of the thorax there are no suspicious appearing pulmonary nodules or masses, there is no acute consolidative airspace disease, no pleural effusions, no pneumothorax and no lymphadenopathy. Visualized portions of the upper abdomen demonstrates severe diffuse low attenuation throughout the visualized hepatic parenchyma, indicative of severe hepatic steatosis. There are no aggressive appearing lytic or blastic lesions noted in the visualized portions of the skeleton.  IMPRESSION: 1. Severe hepatic steatosis.  Electronically Signed: By: Toribio Aye M.D. On: 06/24/2021 11:57     ______________________________________________________________________________________________       Current Reported Medications:.    Current Meds  Medication Sig   aspirin  EC 81 MG tablet Take 1 tablet (81 mg total) by mouth daily. Swallow whole.   clorazepate  (TRANXENE ) 7.5 MG tablet TAKE 1 CAPSULE  DAILY MONDAY THRU THURSDAY AND IF NEEDED ON OTHER DAYS.   cyclobenzaprine  (FLEXERIL ) 10 MG tablet Take 10 mg by mouth as needed for muscle spasms.   ezetimibe  (ZETIA ) 10 MG tablet Take 1 tablet (10 mg total) by mouth daily after supper.   IBUPROFEN PO Take 600 mg by mouth daily as needed (takes 1-3 x week. (600mg  tablet) pain).  metoprolol  tartrate (LOPRESSOR ) 50 MG tablet Take 1 tablet (50 mg total) by mouth 2 (two) times daily.   Multiple Vitamins-Minerals (MULTIVITAMIN PO) Take 1 tablet  by mouth daily.   olmesartan  (BENICAR ) 20 MG tablet Take 1 tablet (20 mg total) by mouth daily.   pantoprazole  (PROTONIX ) 20 MG tablet Take 1 tablet (20 mg total) by mouth daily.   primidone  (MYSOLINE ) 50 MG tablet TAKE 1 TABLET BY MOUTH  DAILY AND AN ADDITIONAL 1/2 TABLET DAILY AS NEEDED   REPATHA SURECLICK 140 MG/ML SOAJ 1 ML SUBCUTANEOUS EVERY 2 WEEKS 30 DAYS   saccharomyces boulardii (FLORASTOR) 250 MG capsule Take 250 mg by mouth 2 (two) times daily.   venlafaxine XR (EFFEXOR-XR) 75 MG 24 hr capsule TAKE 1 CAPSULE BY MOUTH EVERY DAY WITH FOOD; Duration: 90   [DISCONTINUED] nitroGLYCERIN  (NITROSTAT ) 0.4 MG SL tablet Place 1 tablet (0.4 mg total) under the tongue every 5 (five) minutes as needed for up to 25 days for chest pain.   [DISCONTINUED] omeprazole (PRILOSEC) 20 MG capsule Take 20 mg by mouth daily.   Physical Exam:    VS:  BP 119/80 (BP Location: Left Arm, Patient Position: Sitting, Cuff Size: Normal)   Pulse 87   Ht 5' 2.5 (1.588 m)   Wt 213 lb 12.8 oz (97 kg)   LMP 07/15/2014 (Within Days)   SpO2 98%   BMI 38.48 kg/m    Wt Readings from Last 3 Encounters:  08/01/24 213 lb 12.8 oz (97 kg)  06/20/24 209 lb 9.6 oz (95.1 kg)  05/14/23 205 lb 12.8 oz (93.4 kg)    GEN: Well nourished, well developed in no acute distress NECK: No JVD; No carotid bruits CARDIAC: RRR, no murmurs, rubs, gallops RESPIRATORY:  Clear to auscultation without rales, wheezing or rhonchi  ABDOMEN: Soft, non-tender, non-distended EXTREMITIES:  No edema; No acute deformity     Asessement and Plan:.    CAD: Coronary CTA in 06/2021 indicated total coronary calcium  score of 148, this was 97th percentile for age and sex matched control ostial to proximal LAD with 50 to 69% stenosis due to calcified plaques, noted to be closer to 50%, accuracy was limited due to blooming artifact, CT FFR analysis showed no significant stenosis. Nuclear stress test and recent echo reviewed by Dr. Ladona, he felt LVEF to be 50  to 55% and felt stress test was more towards intermediate risk given history of LAD stenosis.  He has recommended proceeding with cardiac catheterization. Today she reports continues to have increased fatigue, dyspnea on exertion and intermittent chest discomfort.  She notes that she will have a sharp chest discomfort that quickly resolves after a few seconds, not associate with exertion.  She has noted significant fatigue in recent months without any improvement.  Reviewed patient's echocardiogram and nuclear stress test as well as Dr. Godfrey recommendations, she is in agreement to proceeding with cardiac catheterization, please see consent below. Patient has been scheduled for cardiac catheterization with Dr. Gordy Ladona on 08/26/24.  Patient will start aspirin  81 mg daily, she will start taking every other day then next week transition to daily.  Encouraged patient to use enteric-coated aspirin  and start Protonix  20 mg daily.  Reviewed indication for nitroglycerin .  Reviewed ED precautions.  Continue Zetia  10 mg daily, metoprolol  tartrate 50 mg twice daily, olmesartan  20 mg daily. Check CBC and BMET.  Informed Consent   Shared Decision Making/Informed Consent The risks [stroke (1 in 1000), death (1 in 1000), kidney failure [usually temporary] (  1 in 500), bleeding (1 in 200), allergic reaction [possibly serious] (1 in 200)], benefits (diagnostic support and management of coronary artery disease) and alternatives of a cardiac catheterization were discussed in detail with Ms. Dazey and she is willing to proceed.     Hyperlipidemia: Last lipid profile in 06/09/2024 indicated total cholesterol 123, HDL 60, glycerides 261 and LDL 43.  Continue Repatha, simvastatin  40 mg daily and Zetia  10 mg daily.  Hypertension: Blood pressure today 119/80.  Continue current antihypertensive regimen.   Disposition: F/u with Sheilia Reznick, NP two weeks post LHC.   Signed, Jahzion Brogden D Deklynn Charlet, NP

## 2024-07-31 NOTE — Progress Notes (Unsigned)
 Cardiology Office Note    Date:  08/01/2024  ID:  Jamie Trujillo, DOB 08-07-1966, MRN [id number] PCP:  Royden Ronal Czar, FNP  Cardiologist:  Gordy Bergamo, MD  Electrophysiologist:  None   Chief Complaint: Follow up for fatigue and shortness of breath   History of Present Illness: .   Jamie Trujillo is a 58 y.o. female with visit-pertinent history of moderate coronary disease by coronary CTA in 06/2021, hypertension, hyperlipidemia, anxiety and benign essential tremor.   Patient evaluated by Dr. Bergamo initially in 2022 with reports of chest pain.  Echocardiogram in 05/2021 indicated normal LV systolic function with EF 50 to 55%, normal diastolic filling pattern, mild concentric LVH.  MPI in 05/2021 was normal and low risk.  Coronary CTA in 06/2021 indicated total coronary calcium  score of 148, this was 97th percentile for age and sex matched control ostial to proximal LAD with 50 to 69% stenosis due to calcified plaques, noted to be closer to 50%, accuracy was limited due to blooming artifact, CT FFR analysis showed no significant stenosis.    Patient was last seen in clinic by Dr. Bergamo in 03/2023, noted to feel the best she had in a a while.  Patient denied any further chest pain, and reported that her blood pressure had been very well-controlled.   Patient was last seen in clinic on 06/20/2024 for follow-up and concerns regarding fatigue.  She reported that she had recently been notified that her fatty liver disease had worsened, was following with her PCP regarding this.  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  She did note a few episodes of fast and sharp chest pain that resolved within a few seconds, denied reoccurrence with exertion.  Patient was noted to be tachycardic at visit however reported she been missing doses of her metoprolol .  Echocardiogram and  newly her stress test was ordered for further evaluation.  Nuclear stress test on 07/29/2024 indicated findings consistent with ischemia, there is a small defect with mild reduction in uptake present in the apical septal location that was reversible, normal wall motion the defect area consistent with ischemia, nuclear stress EF 77%, moderate coronary calcifications were present.  Dr. Bergamo reviewed patient's echocardiogram and nuclear stress test results, he felt that her stress test was likely more intermediate risk, noting history of moderate stenosis in the LAD, he read LVEF at 50 to 55% with recommendation for possible cardiac catheterization.  Today she presents for follow-up.  She reports that she is doing okay, continues to note increased fatigue and shortness of breath.  She reports occasional episodes of a sharp and fast chest discomfort that resolves after a few seconds, not associate with exertion.  Her palpitations have improved with consistent usage of metoprolol .  She denies any increased lower extremity edema, orthopnea or PND.  Reviewed patient's nuclear stress test and echocardiogram results with her, she is in agreement with proceeding with cardiac catheterization. ROS: .   Today she denies lower extremity edema, melena, hematuria, hemoptysis, diaphoresis, presyncope, syncope, orthopnea, and PND.  All other systems are reviewed and otherwise negative. Studies Reviewed: SABRA   EKG:  EKG is ordered today, personally reviewed, demonstrating  EKG Interpretation Date/Time:  Friday August 01 2024 10:27:25 EDT Ventricular Rate:  87 PR Interval:  154 QRS Duration:  72 QT Interval:  362 QTC Calculation: 435 R Axis:   -  23  Text Interpretation: Normal sinus rhythm Minimal voltage criteria for LVH, may be normal variant ( R in aVL ) When compared with ECG of 20-Jun-2024 11:47, No significant change was found Heart rate has improved compared to prior ECG Confirmed by Iva Posten [phone]) on  08/01/2024 11:01:08 AM   CV Studies: Cardiac studies reviewed are outlined and summarized above. Otherwise please see EMR for full report. Cardiac Studies & Procedures   ______________________________________________________________________________________________   STRESS TESTS  MYOCARDIAL PERFUSION IMAGING 07/29/2024  Interpretation Summary   Findings are consistent with ischemia. The study is low risk.   The ECG was not diagnostic due to pharmacologic protocol.   LV perfusion is abnormal. There is evidence of ischemia. Defect 1: There is a small defect with mild reduction in uptake present in the apical septal location(s) that is reversible. There is normal wall motion in the defect area. Consistent with ischemia.   Left ventricular function is normal. Nuclear stress EF: 77%. The left ventricular ejection fraction is hyperdynamic (>65%). End diastolic cavity size is normal. End systolic cavity size is normal.   CT images were obtained for attenuation correction and were examined for the presence of coronary calcium  when appropriate.   Coronary calcium  was present on the attenuation correction CT images. Moderate coronary calcifications were present. Coronary calcifications were present in the left anterior descending artery distribution(s).   Prior study available for comparison from 05/23/2021. There are changes compared to prior study which appear to be new.  Very small area at the apical septum with mild perfusion defect with stress, seen on both corrected and uncorrected images. Suggests small focal area of ischemia, but given mild severity/small area, study is overall low risk.   ECHOCARDIOGRAM  ECHOCARDIOGRAM COMPLETE 07/29/2024  Narrative ECHOCARDIOGRAM REPORT    Patient Name:   Jamie Trujillo Date of Exam: 07/29/2024 Medical Rec #:  [id number]     Height:       62.0 in Accession #:    [id number]    Weight:       209.6 lb Date of Birth:  1966-03-21     BSA:          1.950  m Patient Age:    58 years      BP:           106/66 mmHg Patient Gender: F             HR:           77 bpm. Exam Location:  [address]  Procedure: 2D Echo, Cardiac Doppler, Color Doppler and Intracardiac Opacification Agent (Both Spectral and Color Flow Doppler were utilized during procedure).  Indications:    R06.00 Dyspnea  History:        Patient has prior history of Echocardiogram examinations, most recent 05/13/2021. Signs/Symptoms:Murmur; Risk Factors:Hypertension and Dyslipidemia.  Sonographer:    Carl Coma RDCS Referring Phys: [id number] Kacyn Souder D Lekeith Wulf  IMPRESSIONS   1. Left ventricular ejection fraction, by estimation, is 45 to 50%. Left ventricular ejection fraction by PLAX is 57 %. The left ventricle has mildly decreased function. The left ventricle demonstrates global hypokinesis. Left ventricular diastolic parameters are consistent with Grade I diastolic dysfunction (impaired relaxation). 2. Right ventricular systolic function is normal. The right ventricular size is normal. 3. The mitral valve is normal in structure. No evidence of mitral valve regurgitation. No evidence of mitral stenosis. 4. The aortic valve is tricuspid. Aortic valve regurgitation is not visualized. No aortic stenosis is present.  5. The inferior vena cava is normal in size with greater than 50% respiratory variability, suggesting right atrial pressure of 3 mmHg.  FINDINGS Left Ventricle: Left ventricular ejection fraction, by estimation, is 45 to 50%. Left ventricular ejection fraction by PLAX is 57 %. The left ventricle has mildly decreased function. The left ventricle demonstrates global hypokinesis. Definity contrast agent was given IV to delineate the left ventricular endocardial borders. The left ventricular internal cavity size was normal in size. There is no left ventricular hypertrophy. Left ventricular diastolic parameters are consistent with Grade I diastolic dysfunction  (impaired relaxation).  Right Ventricle: The right ventricular size is normal. No increase in right ventricular wall thickness. Right ventricular systolic function is normal.  Left Atrium: Left atrial size was normal in size.  Right Atrium: Right atrial size was normal in size.  Pericardium: There is no evidence of pericardial effusion.  Mitral Valve: The mitral valve is normal in structure. No evidence of mitral valve regurgitation. No evidence of mitral valve stenosis.  Tricuspid Valve: The tricuspid valve is normal in structure. Tricuspid valve regurgitation is mild . No evidence of tricuspid stenosis.  Aortic Valve: The aortic valve is tricuspid. Aortic valve regurgitation is not visualized. No aortic stenosis is present.  Pulmonic Valve: The pulmonic valve was normal in structure. Pulmonic valve regurgitation is not visualized. No evidence of pulmonic stenosis.  Aorta: The aortic root is normal in size and structure.  Venous: The inferior vena cava is normal in size with greater than 50% respiratory variability, suggesting right atrial pressure of 3 mmHg.  IAS/Shunts: No atrial level shunt detected by color flow Doppler.   LEFT VENTRICLE PLAX 2D LV EF:         Left            Diastology ventricular     LV e' medial:    6.20 cm/s ejection        LV E/e' medial:  9.2 fraction by     LV e' lateral:   8.34 cm/s PLAX is 57      LV E/e' lateral: 6.8 %. LVIDd:         4.70 cm LVIDs:         3.30 cm LV PW:         0.90 cm LV IVS:        0.60 cm LVOT diam:     1.90 cm LV SV:         44 LV SV Index:   23 LVOT Area:     2.84 cm   RIGHT VENTRICLE RV Basal diam:  3.40 cm RV S prime:     9.28 cm/s TAPSE (M-mode): 1.9 cm  LEFT ATRIUM             Index        RIGHT ATRIUM           Index LA diam:        3.80 cm 1.95 cm/m   RA Area:     13.10 cm LA Vol (A2C):   23.2 ml 11.90 ml/m  RA Volume:   36.70 ml  18.82 ml/m LA Vol (A4C):   35.7 ml 18.31 ml/m LA Biplane Vol: 28.9  ml 14.82 ml/m AORTIC VALVE LVOT Vmax:   73.17 cm/s LVOT Vmean:  49.967 cm/s LVOT VTI:    0.156 m  AORTA Ao Root diam: 3.00 cm Ao Asc diam:  2.90 cm  MITRAL VALVE  TRICUSPID VALVE MV Area (PHT): 3.78 cm    TR Peak grad:   21.2 mmHg MV Decel Time: 201 msec    TR Vmax:        230.00 cm/s MV E velocity: 56.85 cm/s MV A velocity: 73.90 cm/s  SHUNTS MV E/A ratio:  0.77        Systemic VTI:  0.16 m Systemic Diam: 1.90 cm  Annabella Scarce MD Electronically signed by Annabella Scarce MD Signature Date/Time: 07/29/2024/12:58:09 PM    Final      CT SCANS  CT CORONARY FRACTIONAL FLOW RESERVE DATA PREP 06/27/2021  Narrative EXAM: CT FFR ANALYSIS  CLINICAL DATA:  Chest pain, nonspecific  FINDINGS: FFRct analysis was performed on the original cardiac CT angiogram dataset. Diagrammatic representation of the FFRct analysis is provided in a separate PDF document in PACS. This dictation was created using the PDF document and an interactive 3D model of the results. 3D model is not available in the EMR/PACS. Normal FFR range is >0.80. Indeterminate (grey) zone is 0.76-0.80.  1. Left Main: FFR = 0.98  2. LAD: Proximal FFR = 0.96, mid FFR = 0.94, distal FFR = 0.91 3. LCX: Proximal FFR = 0.98, distal FFR = 0.97 4. RCA: Proximal FFR = 0.99, mid FFR =0.96, distal FFR = 0.96  IMPRESSION: 1.  CT FFR analysis showed no significant stenosis.  RECOMMENDATIONS: Goal directed medical therapy and aggressive risk factor modification for secondary prevention of coronary artery disease.   Electronically Signed By: Madonna Large D.O. On: 07/03/2021 22:45   CT SCANS  CT CORONARY MORPH W/CTA COR W/SCORE 06/24/2021  Addendum 06/26/2021  6:07 PM ADDENDUM REPORT: 06/26/2021 18:05  HISTORY: Chest pain/anginal equiv, ECGs and troponins normal  EXAM: Cardiac/Coronary  CT  TECHNIQUE: The patient was scanned on a [employer].  PROTOCOL: A 120 kV prospective  scan was triggered in the descending thoracic aorta at 111 HU's. Axial non-contrast 3 mm slices were carried out through the heart. The data set was analyzed on a dedicated work station and scored using the Agatson method. Gantry rotation speed was 250 msecs and collimation was .6 mm. No IV beta blockade but 0.8 mg of sl NTG was given. The 3D data set was reconstructed in 5% intervals of the 67-82 % of the R-R cycle. Diastolic phases were analyzed on a dedicated work station using MPR, MIP and VRT modes. The patient received 100mL OMNIPAQUE  IOHEXOL  350 MG/ML SOLN of contrast.  FINDINGS: Image quality: Average.  Artifact: Moderate (respiratory).  Coronary artery calcification score:  Left main: 0  Left anterior descending artery: 118  Left circumflex artery: 29.3  Right coronary artery: 0.305  Total coronary calcium  score of 148, places the patient at the 97th percentile for age and sex matched control.  Coronary arteries: Normal coronary origins.  Right dominance.  Left Main Coronary Artery: The left main is a normal caliber vessel with a normal take off from the left coronary cusp that bifurcates to form a left anterior descending artery and a left circumflex artery. There is no plaque or stenosis.  Left Anterior Descending Coronary Artery: Normal caliber vessel, wraps the apex, gives off 2 patent diagonal branches. Moderate stenosis (50-69%) due to calcified plaque within the ostial/proximal LAD (closer to 50%). Mid to distal LAD is patent without evidence of any significant plaque or stenosis. First diagonal branch is patent with minimal calcified plaque. Second diagonal branch is patent.  Left Circumflex Artery: Normal caliber vessel, non-dominant, travels within the atrioventricular  groove, gives off 1 patent obtuse marginal branches. The LCX is patent with no evidence of plaque or stenosis. Mild stenosis (25-49%) due to calcified plaque at ostial OM1 otherwise  vessel is patent.  Right Coronary Artery: The RCA is dominant with normal take off from the right coronary cusp. The RCA terminates as a PDA and right posterolateral branch. Minimal stenosis (0-24%) due to mixed plaque within proximal RCA, mid to distal RCA is patent without evidence of plaque or stenosis.  Left Atrium: Grossly normal in size with no left atrial appendage filling defect.  Left Ventricle: Grossly normal in size. There are no stigmata of prior infarction. There is no abnormal filling defect.  Pulmonary arteries: Normal in size without proximal filling defect.  Pulmonary veins: Normal pulmonary venous drainage.  Aorta: Normal size, 25.3 mm at the mid ascending aorta (level of the PA bifurcation) measured double oblique. No dissection.  Pericardium: Normal thickness with no significant effusion or calcium  present.  Cardiac valves: The aortic valve is trileaflet without significant calcification. The mitral valve is normal structure without significant calcification.  Extra-cardiac findings: See attached radiology report for non-cardiac structures.  IMPRESSION: 1. Total coronary calcium  score of 148. This was 97th percentile for age and sex matched control.  2. Normal coronary origin with right dominance.  3. CAD-RADS = 3.  Left Main: Patent  LAD: Ostial-proximal (50-69%) stenosis due to calcified plaque (closer to 50% stenosis, accuracy limited due to blooming artifact). Mid-distal LAD patent.  LCX:  Patent.  OM1: Ostial-proximal (25-49%) stenosis due to calcified plaque.  RCA: Proximal (0-24%) stenosis due to mixed plaque otherwise the vessel is patent.  4. Study is sent for CT-FFR to further evaluate ostial / proximal LAD stenosis. Report will be performed and reported separately.  RECOMMENDATIONS:  Consider symptom-guided anti-ischemic pharmacotherapy as well as risk factor modification per guideline directed care.  Additional analysis with  CT FFR will be submitted.   Electronically Signed By: Madonna Large D.O. On: 06/26/2021 18:05  Narrative EXAM: OVER-READ INTERPRETATION  CT CHEST  The following report is an over-read performed by radiologist Dr. Toribio Aye of [facility] on 06/24/2021. This over-read does not include interpretation of cardiac or coronary anatomy or pathology. The coronary calcium  score/coronary CTA interpretation by the cardiologist is attached.  COMPARISON:  None.  FINDINGS: Within the visualized portions of the thorax there are no suspicious appearing pulmonary nodules or masses, there is no acute consolidative airspace disease, no pleural effusions, no pneumothorax and no lymphadenopathy. Visualized portions of the upper abdomen demonstrates severe diffuse low attenuation throughout the visualized hepatic parenchyma, indicative of severe hepatic steatosis. There are no aggressive appearing lytic or blastic lesions noted in the visualized portions of the skeleton.  IMPRESSION: 1. Severe hepatic steatosis.  Electronically Signed: By: Toribio Aye M.D. On: 06/24/2021 11:57     ______________________________________________________________________________________________       Current Reported Medications:.    Current Meds  Medication Sig   aspirin  EC 81 MG tablet Take 1 tablet (81 mg total) by mouth daily. Swallow whole.   clorazepate  (TRANXENE ) 7.5 MG tablet TAKE 1 CAPSULE  DAILY MONDAY THRU THURSDAY AND IF NEEDED ON OTHER DAYS.   cyclobenzaprine  (FLEXERIL ) 10 MG tablet Take 10 mg by mouth as needed for muscle spasms.   ezetimibe  (ZETIA ) 10 MG tablet Take 1 tablet (10 mg total) by mouth daily after supper.   IBUPROFEN PO Take 600 mg by mouth daily as needed (takes 1-3 x week. (600mg  tablet) pain).  metoprolol  tartrate (LOPRESSOR ) 50 MG tablet Take 1 tablet (50 mg total) by mouth 2 (two) times daily.   Multiple Vitamins-Minerals (MULTIVITAMIN PO) Take 1 tablet  by mouth daily.   olmesartan  (BENICAR ) 20 MG tablet Take 1 tablet (20 mg total) by mouth daily.   pantoprazole (PROTONIX) 20 MG tablet Take 1 tablet (20 mg total) by mouth daily.   primidone  (MYSOLINE ) 50 MG tablet TAKE 1 TABLET BY MOUTH  DAILY AND AN ADDITIONAL 1/2 TABLET DAILY AS NEEDED   REPATHA SURECLICK 140 MG/ML SOAJ 1 ML SUBCUTANEOUS EVERY 2 WEEKS 30 DAYS   saccharomyces boulardii (FLORASTOR) 250 MG capsule Take 250 mg by mouth 2 (two) times daily.   venlafaxine XR (EFFEXOR-XR) 75 MG 24 hr capsule TAKE 1 CAPSULE BY MOUTH EVERY DAY WITH FOOD; Duration: 90   [DISCONTINUED] nitroGLYCERIN  (NITROSTAT ) 0.4 MG SL tablet Place 1 tablet (0.4 mg total) under the tongue every 5 (five) minutes as needed for up to 25 days for chest pain.   [DISCONTINUED] omeprazole (PRILOSEC) 20 MG capsule Take 20 mg by mouth daily.   Physical Exam:    VS:  BP 119/80 (BP Location: Left Arm, Patient Position: Sitting, Cuff Size: Normal)   Pulse 87   Ht 5' 2.5 (1.588 m)   Wt 213 lb 12.8 oz (97 kg)   LMP 07/15/2014 (Within Days)   SpO2 98%   BMI 38.48 kg/m    Wt Readings from Last 3 Encounters:  08/01/24 213 lb 12.8 oz (97 kg)  06/20/24 209 lb 9.6 oz (95.1 kg)  05/14/23 205 lb 12.8 oz (93.4 kg)    GEN: Well nourished, well developed in no acute distress NECK: No JVD; No carotid bruits CARDIAC: RRR, no murmurs, rubs, gallops RESPIRATORY:  Clear to auscultation without rales, wheezing or rhonchi  ABDOMEN: Soft, non-tender, non-distended EXTREMITIES:  No edema; No acute deformity     Asessement and Plan:.    CAD: Coronary CTA in 06/2021 indicated total coronary calcium  score of 148, this was 97th percentile for age and sex matched control ostial to proximal LAD with 50 to 69% stenosis due to calcified plaques, noted to be closer to 50%, accuracy was limited due to blooming artifact, CT FFR analysis showed no significant stenosis. Nuclear stress test and recent echo reviewed by Dr. Ladona, he felt LVEF to be 50  to 55% and felt stress test was more towards intermediate risk given history of LAD stenosis.  He has recommended proceeding with cardiac catheterization. Today she reports continues to have increased fatigue, dyspnea on exertion and intermittent chest discomfort.  She notes that she will have a sharp chest discomfort that quickly resolves after a few seconds, not associate with exertion.  She has noted significant fatigue in recent months without any improvement.  Reviewed patient's echocardiogram and nuclear stress test as well as Dr. Godfrey recommendations, she is in agreement to proceeding with cardiac catheterization, please see consent below. Patient has been scheduled for cardiac catheterization with Dr. Gordy Ladona on 08/26/24.  Patient will start aspirin  81 mg daily, she will start taking every other day then next week transition to daily.  Encouraged patient to use enteric-coated aspirin  and start Protonix 20 mg daily.  Reviewed indication for nitroglycerin .  Reviewed ED precautions.  Continue Zetia  10 mg daily, metoprolol  tartrate 50 mg twice daily, olmesartan  20 mg daily. Check CBC and BMET.  Informed Consent   Shared Decision Making/Informed Consent The risks [stroke (1 in 1000), death (1 in 1000), kidney failure [usually temporary] (  1 in 500), bleeding (1 in 200), allergic reaction [possibly serious] (1 in 200)], benefits (diagnostic support and management of coronary artery disease) and alternatives of a cardiac catheterization were discussed in detail with Ms. Bosler and she is willing to proceed.     Hyperlipidemia: Last lipid profile in 06/09/2024 indicated total cholesterol 123, HDL 60, glycerides 261 and LDL 43.  Continue Repatha, simvastatin  40 mg daily and Zetia  10 mg daily.  Hypertension: Blood pressure today 119/80.  Continue current antihypertensive regimen.   Disposition: F/u with Sumeet Geter, NP two weeks post LHC.   Signed, Rosalind Guido D Derrian Poli, NP

## 2024-08-01 ENCOUNTER — Ambulatory Visit: Payer: Self-pay | Admitting: Cardiology

## 2024-08-01 ENCOUNTER — Encounter: Payer: Self-pay | Admitting: Cardiology

## 2024-08-01 ENCOUNTER — Ambulatory Visit
Admission: RE | Admit: 2024-08-01 | Discharge: 2024-08-01 | Disposition: A | Discharge status: Home / Self Care | Source: Ambulatory Visit | Attending: Registered Nurse | Admitting: Registered Nurse

## 2024-08-01 ENCOUNTER — Ambulatory Visit: Attending: Cardiology | Admitting: Cardiology

## 2024-08-01 VITALS — BP 119/80 | HR 87 | Ht 62.5 in | Wt 213.8 lb

## 2024-08-01 DIAGNOSIS — E7800 Pure hypercholesterolemia, unspecified: Secondary | ICD-10-CM

## 2024-08-01 DIAGNOSIS — I209 Angina pectoris, unspecified: Secondary | ICD-10-CM

## 2024-08-01 DIAGNOSIS — R5383 Other fatigue: Secondary | ICD-10-CM

## 2024-08-01 DIAGNOSIS — Z1231 Encounter for screening mammogram for malignant neoplasm of breast: Secondary | ICD-10-CM

## 2024-08-01 DIAGNOSIS — R931 Abnormal findings on diagnostic imaging of heart and coronary circulation: Secondary | ICD-10-CM

## 2024-08-01 DIAGNOSIS — I10 Essential (primary) hypertension: Secondary | ICD-10-CM | POA: Diagnosis not present

## 2024-08-01 DIAGNOSIS — I25118 Atherosclerotic heart disease of native coronary artery with other forms of angina pectoris: Secondary | ICD-10-CM

## 2024-08-01 DIAGNOSIS — R0602 Shortness of breath: Secondary | ICD-10-CM | POA: Diagnosis not present

## 2024-08-01 MED ORDER — ASPIRIN 81 MG PO TBEC: 81.0000 mg | DELAYED_RELEASE_TABLET | Freq: Every day | ORAL | 2 refills | Status: DC

## 2024-08-01 MED ORDER — NITROGLYCERIN 0.4 MG SL SUBL: 0.4000 mg | SUBLINGUAL_TABLET | SUBLINGUAL | 3 refills | Status: AC | PRN

## 2024-08-01 MED ORDER — PANTOPRAZOLE SODIUM 20 MG PO TBEC: 20.0000 mg | DELAYED_RELEASE_TABLET | Freq: Every day | ORAL | 2 refills | Status: AC

## 2024-08-01 NOTE — Patient Instructions (Addendum)
 Medication Instructions:  Your physician has recommended you make the following change in your medication:  Stop Omeprazole  Start Pantoprazole 20 mg once daily Start Aspirin  81 mg every other day for a week. Then take once daily Continue taking all other medications as prescribed   Labwork: CBC and BMET in 2 weeks (08/18/2024)  Testing/Procedures:  [facility] A DEPT OF [place] [address] A DEPT OF THE [place] [address] [place] [address] Dept: [phone] Loc: [phone]  Jamie Trujillo  08/01/2024  You are scheduled for a Cardiac Catheterization on Tuesday, November 25 with Dr. Gordy Bergamo.  1. Please arrive at the [facility] (Main Entrance A) at [facility]: [address] [place], [place] [address] at 7:30 AM (This time is 2 hour(s) before your procedure to ensure your preparation).   Free valet parking service is available. You will check in at ADMITTING. The support person will be asked to wait in the waiting room.  It is OK to have someone drop you off and come back when you are ready to be discharged.    Special note: Every effort is made to have your procedure done on time. Please understand that emergencies sometimes delay scheduled procedures.  2. Diet: Nothing to eat after midnight.   3. Hydration: You need to be well hydrated before your procedure. On November 25, you may drink approved liquids (see below) until 2 hours before the procedure, with 16 oz of water as your last intake.   List of approved liquids water, clear juice, clear tea, black coffee, fruit juices, non-citric and without pulp, carbonated beverages, Gatorade, Kool -Aid, plain Jello-O and plain ice popsicles.  4. Labs: You will need to have blood drawn on Monday, November 17 at [facility] (1st Floor), [address] [place], [place] [address]. You do not need to be  fasting.  5. Medication instructions in preparation for your procedure:   Contrast Allergy: No  On the morning of your procedure, take your Aspirin  81 mg and any morning medicines NOT listed above.  You may use sips of water.  6. Plan to go home the same day, you will only stay overnight if medically necessary. 7. Bring a current list of your medications and current insurance cards. 8. You MUST have a responsible person to drive you home. 9. Someone MUST be with you the first 24 hours after you arrive home or your discharge will be delayed. 10. Please wear clothes that are easy to get on and off and wear slip-on shoes.  Thank you for allowing us  to care for you!   -- [facility]   Follow-Up: Your physician recommends that you schedule a follow-up appointment in: 2 week Post Cath  Any Other Special Instructions Will Be Listed Below (If Applicable). Thank you for choosing [facility]!     If you need a refill on your cardiac medications before your next appointment, please call your pharmacy.

## 2024-08-19 ENCOUNTER — Ambulatory Visit: Payer: Self-pay | Admitting: Cardiology

## 2024-08-19 LAB — CBC
Hematocrit: 43.8 % (ref 34.0–46.6)
Hemoglobin: 14.5 g/dL (ref 11.1–15.9)
MCH: 32.1 pg (ref 26.6–33.0)
MCHC: 33.1 g/dL (ref 31.5–35.7)
MCV: 97 fL (ref 79–97)
Platelets: 285 x10E3/uL (ref 150–450)
RBC: 4.52 x10E6/uL (ref 3.77–5.28)
RDW: 13 % (ref 11.7–15.4)
WBC: 7.6 x10E3/uL (ref 3.4–10.8)

## 2024-08-19 LAB — BASIC METABOLIC PANEL WITH GFR
BUN/Creatinine Ratio: 13 (ref 9–23)
BUN: 14 mg/dL (ref 6–24)
CO2: 23 mmol/L (ref 20–29)
Calcium: 9.5 mg/dL (ref 8.7–10.2)
Chloride: 105 mmol/L (ref 96–106)
Creatinine, Ser: 1.04 mg/dL — AB (ref 0.57–1.00)
Glucose: 85 mg/dL (ref 70–99)
Potassium: 4.7 mmol/L (ref 3.5–5.2)
Sodium: 141 mmol/L (ref 134–144)
eGFR: 62 mL/min/1.73 (ref >59)

## 2024-08-19 NOTE — Telephone Encounter (Signed)
 Patient read the message Katlyn West, NP sent via MyChart 08/19/24

## 2024-08-26 ENCOUNTER — Other Ambulatory Visit: Payer: Self-pay

## 2024-08-26 ENCOUNTER — Ambulatory Visit (HOSPITAL_COMMUNITY)
Admission: RE | Admit: 2024-08-26 | Discharge: 2024-08-26 | Disposition: A | Discharge status: Home / Self Care | Attending: Cardiology | Admitting: Cardiology

## 2024-08-26 ENCOUNTER — Encounter (HOSPITAL_COMMUNITY)
Admission: RE | Disposition: A | Discharge status: Home / Self Care | Payer: Self-pay | Source: Home / Self Care | Attending: Cardiology

## 2024-08-26 DIAGNOSIS — R5383 Other fatigue: Secondary | ICD-10-CM

## 2024-08-26 DIAGNOSIS — Z7982 Long term (current) use of aspirin: Secondary | ICD-10-CM | POA: Insufficient documentation

## 2024-08-26 DIAGNOSIS — I10 Essential (primary) hypertension: Secondary | ICD-10-CM | POA: Diagnosis not present

## 2024-08-26 DIAGNOSIS — I209 Angina pectoris, unspecified: Secondary | ICD-10-CM

## 2024-08-26 DIAGNOSIS — Z79899 Other long term (current) drug therapy: Secondary | ICD-10-CM | POA: Diagnosis not present

## 2024-08-26 DIAGNOSIS — R931 Abnormal findings on diagnostic imaging of heart and coronary circulation: Secondary | ICD-10-CM

## 2024-08-26 DIAGNOSIS — R0602 Shortness of breath: Secondary | ICD-10-CM

## 2024-08-26 DIAGNOSIS — E785 Hyperlipidemia, unspecified: Secondary | ICD-10-CM | POA: Insufficient documentation

## 2024-08-26 DIAGNOSIS — I2510 Atherosclerotic heart disease of native coronary artery without angina pectoris: Secondary | ICD-10-CM | POA: Diagnosis present

## 2024-08-26 DIAGNOSIS — I2584 Coronary atherosclerosis due to calcified coronary lesion: Secondary | ICD-10-CM | POA: Diagnosis not present

## 2024-08-26 DIAGNOSIS — I25118 Atherosclerotic heart disease of native coronary artery with other forms of angina pectoris: Secondary | ICD-10-CM

## 2024-08-26 DIAGNOSIS — R072 Precordial pain: Secondary | ICD-10-CM

## 2024-08-26 HISTORY — PX: LEFT HEART CATH AND CORONARY ANGIOGRAPHY: CATH118249

## 2024-08-26 SURGERY — LEFT HEART CATH AND CORONARY ANGIOGRAPHY
Anesthesia: LOCAL

## 2024-08-26 MED ORDER — ASPIRIN 81 MG PO CHEW: 81.0000 mg | CHEWABLE_TABLET | ORAL | Status: DC

## 2024-08-26 MED ORDER — LIDOCAINE HCL (PF) 1 % IJ SOLN
INTRAMUSCULAR | Status: AC
  Filled 2024-08-26: qty 30

## 2024-08-26 MED ORDER — SODIUM CHLORIDE 0.9 % IV SOLN: 250.0000 mL | INTRAVENOUS | Status: DC | PRN

## 2024-08-26 MED ORDER — MIDAZOLAM HCL 2 MG/2ML IJ SOLN
INTRAMUSCULAR | Status: AC
  Filled 2024-08-26: qty 2

## 2024-08-26 MED ORDER — VERAPAMIL HCL 2.5 MG/ML IV SOLN
INTRAVENOUS | Status: AC
  Filled 2024-08-26: qty 2

## 2024-08-26 MED ORDER — SODIUM CHLORIDE 0.9% FLUSH: 3.0000 mL | INTRAVENOUS | Status: DC | PRN

## 2024-08-26 MED ORDER — FENTANYL CITRATE (PF) 100 MCG/2ML IJ SOLN
INTRAMUSCULAR | Status: DC | PRN
  Administered 2024-08-26: 25 ug via INTRAVENOUS

## 2024-08-26 MED ORDER — ONDANSETRON HCL 4 MG/2ML IJ SOLN: 4.0000 mg | Freq: Four times a day (QID) | INTRAMUSCULAR | Status: DC | PRN

## 2024-08-26 MED ORDER — IOHEXOL 350 MG/ML SOLN
INTRAVENOUS | Status: DC | PRN
  Administered 2024-08-26 (×2): 50 mL

## 2024-08-26 MED ORDER — LIDOCAINE HCL (PF) 1 % IJ SOLN
INTRAMUSCULAR | Status: AC
Start: 2024-08-26 — End: 2024-08-26
  Filled 2024-08-26: qty 30

## 2024-08-26 MED ORDER — MIDAZOLAM HCL (PF) 2 MG/2ML IJ SOLN
INTRAMUSCULAR | Status: DC | PRN
  Administered 2024-08-26: 2 mg via INTRAVENOUS

## 2024-08-26 MED ORDER — HEPARIN SODIUM (PORCINE) 1000 UNIT/ML IJ SOLN
INTRAMUSCULAR | Status: AC
  Filled 2024-08-26: qty 10

## 2024-08-26 MED ORDER — HEPARIN SODIUM (PORCINE) 1000 UNIT/ML IJ SOLN
INTRAMUSCULAR | Status: DC | PRN
Start: 2024-08-26 — End: 2024-08-26
  Administered 2024-08-26: 5000 [IU] via INTRAVENOUS

## 2024-08-26 MED ORDER — ACETAMINOPHEN 325 MG PO TABS: 650.0000 mg | ORAL_TABLET | ORAL | Status: DC | PRN

## 2024-08-26 MED ORDER — HEPARIN (PORCINE) IN NACL 1000-0.9 UT/500ML-% IV SOLN
INTRAVENOUS | Status: DC | PRN
  Administered 2024-08-26: 1000 mL

## 2024-08-26 MED ORDER — FREE WATER: 500.0000 mL | Freq: Once | Status: DC

## 2024-08-26 MED ORDER — FENTANYL CITRATE (PF) 100 MCG/2ML IJ SOLN
INTRAMUSCULAR | Status: AC
  Filled 2024-08-26: qty 2

## 2024-08-26 MED ORDER — HEPARIN (PORCINE) IN NACL 2-0.9 UNITS/ML
INTRAMUSCULAR | Status: DC | PRN
  Administered 2024-08-26: 10 mL via INTRA_ARTERIAL

## 2024-08-26 MED ORDER — LIDOCAINE HCL (PF) 1 % IJ SOLN
INTRAMUSCULAR | Status: DC | PRN
  Administered 2024-08-26: 2 mL via INTRADERMAL

## 2024-08-26 MED ORDER — SODIUM CHLORIDE 0.9% FLUSH: 3.0000 mL | Freq: Two times a day (BID) | INTRAVENOUS | Status: DC

## 2024-08-26 MED ORDER — ASPIRIN 81 MG PO CHEW
81.0000 mg | CHEWABLE_TABLET | ORAL | Status: AC
  Administered 2024-08-26: 81 mg via ORAL
  Filled 2024-08-26: qty 1

## 2024-08-26 SURGICAL SUPPLY — 10 items
CATH INFINITI 5FR ANG PIGTAIL (CATHETERS) IMPLANT
CATH INFINITI AMBI 5FR TG (CATHETERS) IMPLANT
DEVICE RAD TR BAND REGULAR (VASCULAR PRODUCTS) IMPLANT
GLIDESHEATH SLEND A-KIT 6F 22G (SHEATH) IMPLANT
GLIDESHEATH SLEND SS 6F .021 (SHEATH) IMPLANT
GUIDEWIRE ANGLED .035X150CM (WIRE) IMPLANT
GUIDEWIRE INQWIRE 1.5J.035X260 (WIRE) IMPLANT
PACK CARDIAC CATHETERIZATION (CUSTOM PROCEDURE TRAY) ×1 IMPLANT
SET ATX-X65L (MISCELLANEOUS) IMPLANT
SHEATH PROBE COVER 6X72 (BAG) IMPLANT

## 2024-08-26 NOTE — Progress Notes (Signed)
 Pt called out at 1125 c/o feeling tingly and lightheaded. Pt laid flat, O2 placed and BP cycled. BP had dropped. Pt stated feeling better flat. Second BP 3 min later had came up. After another 3 min, BP was back to baseline. Handed off info to pt's RN as I was covering her for lunch.

## 2024-08-26 NOTE — Discharge Instructions (Signed)

## 2024-08-26 NOTE — Interval H&P Note (Signed)
 History and Physical Interval Note:  08/26/2024 9:46 AM  Jamie Trujillo  has presented today for surgery, with the diagnosis of cad.  The various methods of treatment have been discussed with the patient and family. After consideration of risks, benefits and other options for treatment, the patient has consented to  Procedure(s): LEFT HEART CATH AND CORONARY ANGIOGRAPHY (N/A) and coronary angioplasty as a surgical intervention.  The patient's history has been reviewed, patient examined, no change in status, stable for surgery.  I have reviewed the patient's chart and labs.  Questions were answered to the patient's satisfaction.    Cath Lab Visit (complete for each Cath Lab visit)  Clinical Evaluation Leading to the Procedure:   ACS: No.  Non-ACS:    Anginal Classification: CCS III  Anti-ischemic medical therapy: Minimal Therapy (1 class of medications)  Non-Invasive Test Results: Intermediate-risk stress test findings: cardiac mortality 1-3%/year  Prior CABG: No previous CABG   Gordy Bergamo

## 2024-08-27 ENCOUNTER — Encounter (HOSPITAL_COMMUNITY): Payer: Self-pay | Admitting: Cardiology

## 2024-09-09 NOTE — Progress Notes (Unsigned)
 Cardiology Office Note    Date:  09/09/2024  ID:  Jamie Trujillo, DOB [birth year], MRN [id number] PCP:  Royden Ronal Czar, FNP  Cardiologist:  Gordy Bergamo, MD  Electrophysiologist:  None   Chief Complaint: ***  History of Present Illness: .    Jamie Trujillo is a 58 y.o. female with visit-pertinent history of moderate coronary disease by coronary CTA in 06/2021, hypertension, hyperlipidemia, anxiety and benign essential tremor.   Patient evaluated by Dr. Bergamo initially in 2022 with reports of chest pain.  Echocardiogram in 05/2021 indicated normal LV systolic function with EF 50 to 55%, normal diastolic filling pattern, mild concentric LVH.  MPI in 05/2021 was normal and low risk.  Coronary CTA in 06/2021 indicated total coronary calcium  score of 148, this was 97th percentile for age and sex matched control ostial to proximal LAD with 50 to 69% stenosis due to calcified plaques, noted to be closer to 50%, accuracy was limited due to blooming artifact, CT FFR analysis showed no significant stenosis.    Patient was last seen in clinic by Dr. Bergamo in 03/2023, noted to feel the best she had in a a while.  Patient denied any further chest pain, and reported that her blood pressure had been very well-controlled.   Patient was last seen in clinic on 06/20/2024 for follow-up and concerns regarding fatigue.  She reported that she had recently been notified that her fatty liver disease had worsened, was following with her PCP regarding this.  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  She did note a few episodes of fast and sharp chest pain that resolved within a few seconds, denied reoccurrence with exertion.  Patient was noted to be tachycardic at visit however reported she been missing doses of her metoprolol .  Echocardiogram and newly her stress test was ordered for  further evaluation.  Nuclear stress test on 07/29/2024 indicated findings consistent with ischemia, there is a small defect with mild reduction in uptake present in the apical septal location that was reversible, normal wall motion the defect area consistent with ischemia, nuclear stress EF 77%, moderate coronary calcifications were present.  Dr. Bergamo reviewed patient's echocardiogram and nuclear stress test results, he felt that her stress test was likely more intermediate risk, noting history of moderate stenosis in the LAD, he read LVEF at 50 to 55% with recommendation for possible cardiac catheterization.  Cardiac catheterization on 08/26/2510/25/25 indicated proximal LAD lesion 5% stenosed with mild calcification, otherwise normal coronary arteries.  LVEDP mildly elevated at 20 mmHg.  LVEF 50 to 55% by visual estimate.  Today she presents for follow-up.  She reports that she   CAD: Coronary CTA in 06/2021 indicated total coronary calcium  score of 148, this was 97th percentile for age and sex matched control ostial to proximal LAD with 50 to 69% stenosis due to calcified plaques, noted to be closer to 50%, accuracy was limited due to blooming artifact, CT FFR analysis showed no significant stenosis. Cardiac catheterization on 08/26/2510/25/25 indicated proximal LAD lesion 5% stenosed with mild calcification, otherwise normal coronary arteries.  LVEDP mildly elevated at 20 mmHg.  LVEF 50 to 55% by visual estimate. Today she reports Cath site  Continue   Hypertension: Blood pressure today  Hyperlipidemia: Last lipid profile on 06/09/2024 indicated total cholesterol 123, HDL 60, triglycerides 261 and LDL 43.  Continue Repatha, simvastatin  40  mg daily and Zetia  10 mg daily.  Fatigue: Sleep study???  Labwork independently reviewed:   ROS: .   *** denies chest pain, shortness of breath, lower extremity edema, fatigue, palpitations, melena, hematuria, hemoptysis, diaphoresis, weakness, presyncope,  syncope, orthopnea, and PND.  All other systems are reviewed and otherwise negative.  Studies Reviewed: SABRA    EKG:  EKG is ordered today, personally reviewed, demonstrating ***     CV Studies: Cardiac studies reviewed are outlined and summarized above. Otherwise please see EMR for full report. Cardiac Studies & Procedures   ______________________________________________________________________________________________ CARDIAC CATHETERIZATION  CARDIAC CATHETERIZATION 08/26/2024  Conclusion Images from the original result were not included.    Prox LAD lesion is 5% stenosed with mild calcification. Otherwise normal coronary arterirs.   LV end diastolic pressure is mildly elevated. 20 mm Hg   The left ventricular ejection fraction is 50-55% by visual estimate.   No indication for antiplatelet therapy at this time .  Cardiac Catheterization 08/26/24: Except for mild proximal LAD calcification and a <5 percent stenosis, normal coronary arteries.    Impression and recommendations: Evaluate for non cardiac causes of chest pain. Consider sleep study for daytime somnolense.  Findings Coronary Findings Diagnostic  Dominance: Right  Left Anterior Descending Vessel is angiographically normal. Prox LAD lesion is 5% stenosed.  Left Circumflex Vessel is angiographically normal.  Right Coronary Artery Vessel is angiographically normal.  Intervention  No interventions have been documented.   STRESS TESTS  MYOCARDIAL PERFUSION IMAGING 07/29/2024  Interpretation Summary   Findings are consistent with ischemia. The study is low risk.   The ECG was not diagnostic due to pharmacologic protocol.   LV perfusion is abnormal. There is evidence of ischemia. Defect 1: There is a small defect with mild reduction in uptake present in the apical septal location(s) that is reversible. There is normal wall motion in the defect area. Consistent with ischemia.   Left ventricular function is  normal. Nuclear stress EF: 77%. The left ventricular ejection fraction is hyperdynamic (>65%). End diastolic cavity size is normal. End systolic cavity size is normal.   CT images were obtained for attenuation correction and were examined for the presence of coronary calcium  when appropriate.   Coronary calcium  was present on the attenuation correction CT images. Moderate coronary calcifications were present. Coronary calcifications were present in the left anterior descending artery distribution(s).   Prior study available for comparison from 05/23/2021. There are changes compared to prior study which appear to be new.  Very small area at the apical septum with mild perfusion defect with stress, seen on both corrected and uncorrected images. Suggests small focal area of ischemia, but given mild severity/small area, study is overall low risk.   ECHOCARDIOGRAM  ECHOCARDIOGRAM COMPLETE 07/29/2024  Narrative ECHOCARDIOGRAM REPORT    Patient Name:   NINAH MOCCIO Date of Exam: 07/29/2024 Medical Rec #:  [id number]     Height:       62.0 in Accession #:    [id number]    Weight:       209.6 lb Date of Birth:  [birth year]     BSA:          1.950 m Patient Age:    58 years      BP:           106/66 mmHg Patient Gender: F             HR:           77 bpm.  Exam Location:  [facility]  Procedure: 2D Echo, Cardiac Doppler, Color Doppler and Intracardiac Opacification Agent (Both Spectral and Color Flow Doppler were utilized during procedure).  Indications:    R06.00 Dyspnea  History:        Patient has prior history of Echocardiogram examinations, most recent 05/13/2021. Signs/Symptoms:Murmur; Risk Factors:Hypertension and Dyslipidemia.  Sonographer:    Carl Coma RDCS Referring Phys: [id number] Monty Mccarrell D Aalliyah Kilker  IMPRESSIONS   1. Left ventricular ejection fraction, by estimation, is 45 to 50%. Left ventricular ejection fraction by PLAX is 57 %. The left ventricle has mildly decreased  function. The left ventricle demonstrates global hypokinesis. Left ventricular diastolic parameters are consistent with Grade I diastolic dysfunction (impaired relaxation). 2. Right ventricular systolic function is normal. The right ventricular size is normal. 3. The mitral valve is normal in structure. No evidence of mitral valve regurgitation. No evidence of mitral stenosis. 4. The aortic valve is tricuspid. Aortic valve regurgitation is not visualized. No aortic stenosis is present. 5. The inferior vena cava is normal in size with greater than 50% respiratory variability, suggesting right atrial pressure of 3 mmHg.  FINDINGS Left Ventricle: Left ventricular ejection fraction, by estimation, is 45 to 50%. Left ventricular ejection fraction by PLAX is 57 %. The left ventricle has mildly decreased function. The left ventricle demonstrates global hypokinesis. Definity  contrast agent was given IV to delineate the left ventricular endocardial borders. The left ventricular internal cavity size was normal in size. There is no left ventricular hypertrophy. Left ventricular diastolic parameters are consistent with Grade I diastolic dysfunction (impaired relaxation).  Right Ventricle: The right ventricular size is normal. No increase in right ventricular wall thickness. Right ventricular systolic function is normal.  Left Atrium: Left atrial size was normal in size.  Right Atrium: Right atrial size was normal in size.  Pericardium: There is no evidence of pericardial effusion.  Mitral Valve: The mitral valve is normal in structure. No evidence of mitral valve regurgitation. No evidence of mitral valve stenosis.  Tricuspid Valve: The tricuspid valve is normal in structure. Tricuspid valve regurgitation is mild . No evidence of tricuspid stenosis.  Aortic Valve: The aortic valve is tricuspid. Aortic valve regurgitation is not visualized. No aortic stenosis is present.  Pulmonic Valve: The pulmonic  valve was normal in structure. Pulmonic valve regurgitation is not visualized. No evidence of pulmonic stenosis.  Aorta: The aortic root is normal in size and structure.  Venous: The inferior vena cava is normal in size with greater than 50% respiratory variability, suggesting right atrial pressure of 3 mmHg.  IAS/Shunts: No atrial level shunt detected by color flow Doppler.   LEFT VENTRICLE PLAX 2D LV EF:         Left            Diastology ventricular     LV e' medial:    6.20 cm/s ejection        LV E/e' medial:  9.2 fraction by     LV e' lateral:   8.34 cm/s PLAX is 57      LV E/e' lateral: 6.8 %. LVIDd:         4.70 cm LVIDs:         3.30 cm LV PW:         0.90 cm LV IVS:        0.60 cm LVOT diam:     1.90 cm LV SV:         44 LV SV Index:  23 LVOT Area:     2.84 cm   RIGHT VENTRICLE RV Basal diam:  3.40 cm RV S prime:     9.28 cm/s TAPSE (M-mode): 1.9 cm  LEFT ATRIUM             Index        RIGHT ATRIUM           Index LA diam:        3.80 cm 1.95 cm/m   RA Area:     13.10 cm LA Vol (A2C):   23.2 ml 11.90 ml/m  RA Volume:   36.70 ml  18.82 ml/m LA Vol (A4C):   35.7 ml 18.31 ml/m LA Biplane Vol: 28.9 ml 14.82 ml/m AORTIC VALVE LVOT Vmax:   73.17 cm/s LVOT Vmean:  49.967 cm/s LVOT VTI:    0.156 m  AORTA Ao Root diam: 3.00 cm Ao Asc diam:  2.90 cm  MITRAL VALVE               TRICUSPID VALVE MV Area (PHT): 3.78 cm    TR Peak grad:   21.2 mmHg MV Decel Time: 201 msec    TR Vmax:        230.00 cm/s MV E velocity: 56.85 cm/s MV A velocity: 73.90 cm/s  SHUNTS MV E/A ratio:  0.77        Systemic VTI:  0.16 m Systemic Diam: 1.90 cm  Annabella Scarce MD Electronically signed by Annabella Scarce MD Signature Date/Time: 07/29/2024/12:58:09 PM    Final      CT SCANS  CT CORONARY FRACTIONAL FLOW RESERVE DATA PREP 06/27/2021  Narrative EXAM: CT FFR ANALYSIS  CLINICAL DATA:  Chest pain, nonspecific  FINDINGS: FFRct analysis was performed on the  original cardiac CT angiogram dataset. Diagrammatic representation of the FFRct analysis is provided in a separate PDF document in PACS. This dictation was created using the PDF document and an interactive 3D model of the results. 3D model is not available in the EMR/PACS. Normal FFR range is >0.80. Indeterminate (grey) zone is 0.76-0.80.  1. Left Main: FFR = 0.98  2. LAD: Proximal FFR = 0.96, mid FFR = 0.94, distal FFR = 0.91 3. LCX: Proximal FFR = 0.98, distal FFR = 0.97 4. RCA: Proximal FFR = 0.99, mid FFR =0.96, distal FFR = 0.96  IMPRESSION: 1.  CT FFR analysis showed no significant stenosis.  RECOMMENDATIONS: Goal directed medical therapy and aggressive risk factor modification for secondary prevention of coronary artery disease.   Electronically Signed By: Madonna Large D.O. On: 07/03/2021 22:45   CT SCANS  CT CORONARY MORPH W/CTA COR W/SCORE 06/24/2021  Addendum 06/26/2021  6:07 PM ADDENDUM REPORT: 06/26/2021 18:05  HISTORY: Chest pain/anginal equiv, ECGs and troponins normal  EXAM: Cardiac/Coronary  CT  TECHNIQUE: The patient was scanned on a [employer].  PROTOCOL: A 120 kV prospective scan was triggered in the descending thoracic aorta at 111 HU's. Axial non-contrast 3 mm slices were carried out through the heart. The data set was analyzed on a dedicated work station and scored using the Agatson method. Gantry rotation speed was 250 msecs and collimation was .6 mm. No IV beta blockade but 0.8 mg of sl NTG was given. The 3D data set was reconstructed in 5% intervals of the 67-82 % of the R-R cycle. Diastolic phases were analyzed on a dedicated work station using MPR, MIP and VRT modes. The patient received OMNIPAQUE  IOHEXOL  350 MG/ML SOLN of contrast.  FINDINGS:  Image quality: Average.  Artifact: Moderate (respiratory).  Coronary artery calcification score:  Left main: 0  Left anterior descending artery: 118  Left circumflex  artery: 29.3  Right coronary artery: 0.305  Total coronary calcium  score of 148, places the patient at the 97th percentile for age and sex matched control.  Coronary arteries: Normal coronary origins.  Right dominance.  Left Main Coronary Artery: The left main is a normal caliber vessel with a normal take off from the left coronary cusp that bifurcates to form a left anterior descending artery and a left circumflex artery. There is no plaque or stenosis.  Left Anterior Descending Coronary Artery: Normal caliber vessel, wraps the apex, gives off 2 patent diagonal branches. Moderate stenosis (50-69%) due to calcified plaque within the ostial/proximal LAD (closer to 50%). Mid to distal LAD is patent without evidence of any significant plaque or stenosis. First diagonal branch is patent with minimal calcified plaque. Second diagonal branch is patent.  Left Circumflex Artery: Normal caliber vessel, non-dominant, travels within the atrioventricular groove, gives off 1 patent obtuse marginal branches. The LCX is patent with no evidence of plaque or stenosis. Mild stenosis (25-49%) due to calcified plaque at ostial OM1 otherwise vessel is patent.  Right Coronary Artery: The RCA is dominant with normal take off from the right coronary cusp. The RCA terminates as a PDA and right posterolateral branch. Minimal stenosis (0-24%) due to mixed plaque within proximal RCA, mid to distal RCA is patent without evidence of plaque or stenosis.  Left Atrium: Grossly normal in size with no left atrial appendage filling defect.  Left Ventricle: Grossly normal in size. There are no stigmata of prior infarction. There is no abnormal filling defect.  Pulmonary arteries: Normal in size without proximal filling defect.  Pulmonary veins: Normal pulmonary venous drainage.  Aorta: Normal size, 25.3 mm at the mid ascending aorta (level of the PA bifurcation) measured double oblique. No  dissection.  Pericardium: Normal thickness with no significant effusion or calcium  present.  Cardiac valves: The aortic valve is trileaflet without significant calcification. The mitral valve is normal structure without significant calcification.  Extra-cardiac findings: See attached radiology report for non-cardiac structures.  IMPRESSION: 1. Total coronary calcium  score of 148. This was 97th percentile for age and sex matched control.  2. Normal coronary origin with right dominance.  3. CAD-RADS = 3.  Left Main: Patent  LAD: Ostial-proximal (50-69%) stenosis due to calcified plaque (closer to 50% stenosis, accuracy limited due to blooming artifact). Mid-distal LAD patent.  LCX:  Patent.  OM1: Ostial-proximal (25-49%) stenosis due to calcified plaque.  RCA: Proximal (0-24%) stenosis due to mixed plaque otherwise the vessel is patent.  4. Study is sent for CT-FFR to further evaluate ostial / proximal LAD stenosis. Report will be performed and reported separately.  RECOMMENDATIONS:  Consider symptom-guided anti-ischemic pharmacotherapy as well as risk factor modification per guideline directed care.  Additional analysis with CT FFR will be submitted.   Electronically Signed By: Madonna Large D.O. On: 06/26/2021 18:05  Narrative EXAM: OVER-READ INTERPRETATION  CT CHEST  The following report is an over-read performed by radiologist Dr. Toribio Aye of [facility] on 06/24/2021. This over-read does not include interpretation of cardiac or coronary anatomy or pathology. The coronary calcium  score/coronary CTA interpretation by the cardiologist is attached.  COMPARISON:  None.  FINDINGS: Within the visualized portions of the thorax there are no suspicious appearing pulmonary nodules or masses, there is no acute consolidative airspace disease, no pleural  effusions, no pneumothorax and no lymphadenopathy. Visualized portions of the upper abdomen  demonstrates severe diffuse low attenuation throughout the visualized hepatic parenchyma, indicative of severe hepatic steatosis. There are no aggressive appearing lytic or blastic lesions noted in the visualized portions of the skeleton.  IMPRESSION: 1. Severe hepatic steatosis.  Electronically Signed: By: Toribio Aye M.D. On: 06/24/2021 11:57     ______________________________________________________________________________________________       Current Reported Medications:.    No outpatient medications have been marked as taking for the 09/11/24 encounter (Appointment) with Bairon Klemann D, NP.    Physical Exam:    VS:  LMP 07/15/2014 (Within Days)    Wt Readings from Last 3 Encounters:  08/26/24 217 lb (98.4 kg)  08/01/24 213 lb 12.8 oz (97 kg)  06/20/24 209 lb 9.6 oz (95.1 kg)    GEN: Well nourished, well developed in no acute distress NECK: No JVD; No carotid bruits CARDIAC: ***RRR, no murmurs, rubs, gallops RESPIRATORY:  Clear to auscultation without rales, wheezing or rhonchi  ABDOMEN: Soft, non-tender, non-distended EXTREMITIES:  No edema; No acute deformity     Asessement and Plan:.     ***     Disposition: F/u with ***  Signed, Matison Nuccio D Demetra Moya, NP

## 2024-09-11 ENCOUNTER — Ambulatory Visit: Attending: Cardiology | Admitting: Cardiology

## 2024-09-11 ENCOUNTER — Ambulatory Visit

## 2024-09-11 ENCOUNTER — Encounter: Payer: Self-pay | Admitting: Cardiology

## 2024-09-11 VITALS — BP 126/82 | HR 91 | Ht 62.5 in | Wt 220.0 lb

## 2024-09-11 DIAGNOSIS — E782 Mixed hyperlipidemia: Secondary | ICD-10-CM

## 2024-09-11 DIAGNOSIS — I10 Essential (primary) hypertension: Secondary | ICD-10-CM

## 2024-09-11 DIAGNOSIS — R002 Palpitations: Secondary | ICD-10-CM

## 2024-09-11 DIAGNOSIS — R5383 Other fatigue: Secondary | ICD-10-CM

## 2024-09-11 DIAGNOSIS — R0683 Snoring: Secondary | ICD-10-CM

## 2024-09-11 DIAGNOSIS — R400 Somnolence: Secondary | ICD-10-CM

## 2024-09-11 DIAGNOSIS — I2510 Atherosclerotic heart disease of native coronary artery without angina pectoris: Secondary | ICD-10-CM

## 2024-09-11 NOTE — Patient Instructions (Signed)
 Medication Instructions:  Your physician recommends that you continue on your current medications as directed. Please refer to the Current Medication list given to you today.  *If you need a refill on your cardiac medications before your next appointment, please call your pharmacy*  Lab Work: NONE  If you have labs (blood work) drawn today and your tests are completely normal, you will receive your results only by: MyChart Message (if you have MyChart) OR A paper copy in the mail If you have any lab test that is abnormal or we need to change your treatment, we will call you to review the results.  Testing/Procedures: Jamie Trujillo- Long Term Monitor Instructions  Your physician has requested you wear a ZIO patch monitor for __7__ days.   This is a single patch monitor. [employer] supplies one patch monitor per enrollment. Additional  stickers are not available. Please do not apply patch if you will be having a Nuclear Stress Test,  Echocardiogram, Cardiac CT, MRI, or Chest Xray during the period you would be wearing the  monitor. The patch cannot be worn during these tests. You cannot remove and re-apply the  ZIO XT patch monitor.   Your ZIO patch monitor will be mailed 3 day USPS to your address on file. It may take 3-5 days  to receive your monitor after you have been enrolled.   Once you have received your monitor, please review the enclosed instructions. Your monitor  has already been registered assigning a specific monitor serial # to you.     Billing and Patient Assistance Program Information  We have supplied [employer] with any of your insurance information on file for billing purposes.  [employer] offers a sliding scale Patient Assistance Program for patients that do not have  insurance, or whose insurance does not completely cover the cost of the ZIO monitor.  You must apply for the Patient Assistance Program to qualify for this discounted rate.   To apply, please call [employer] at  [phone], select option 4, select option 2, ask to apply for  Patient Assistance Program. Meredeth will ask your household income, and how many people  are in your household. They will quote your out-of-pocket cost based on that information.  [employer] will also be able to set up a 75-month, interest-free payment plan if needed.     Applying the monitor  Shave hair from upper left chest.  Hold abrader disc by orange tab. Rub abrader in 40 strokes over the upper left chest as  indicated in your monitor instructions.  Clean area with 4 enclosed alcohol pads. Let dry.  Apply patch as indicated in monitor instructions. Patch will be placed under collarbone on left  side of chest with arrow pointing upward.  Rub patch adhesive wings for 2 minutes. Remove white label marked 1. Remove the white  label marked 2. Rub patch adhesive wings for 2 additional minutes.  While looking in a mirror, press and release button in center of patch. A small green light will  flash 3-4 times. This will be your only indicator that the monitor has been turned on.  Do not shower for the first 24 hours. You may shower after the first 24 hours.  Press the button if you feel a symptom. You will hear a small click. Record Date, Time and  Symptom in the Patient Logbook.  When you are ready to remove the patch, follow instructions on the last 2 pages of Patient  Logbook. Stick patch monitor onto the  last page of Patient Logbook.   Place Patient Logbook in the blue and white box. Use locking tab on box and tape box closed  securely. The blue and white box has prepaid postage on it. Please place it in the mailbox as  soon as possible. Your physician should have your test results approximately 7 days after the  monitor has been mailed back to [facility].   Call [facility] Customer Care at [phone] if you have questions regarding  your ZIO XT patch monitor. Call them immediately if you see an orange light  blinking on your  monitor.   If your monitor falls off in less than 4 days, contact our Monitor department at [phone].   If your monitor becomes loose or falls off after 4 days call [employer] at [phone] for  suggestions on securing your monitor.     Follow-Up: At [facility], you and your health needs are our priority.  As part of our continuing mission to provide you with exceptional heart care, our providers are all part of one team.  This team includes your primary Cardiologist (physician) and Advanced Practice Providers or APPs (Physician Assistants and Nurse Practitioners) who all work together to provide you with the care you need, when you need it.  Your next appointment:   3 month(s)  Provider:   Gordy Bergamo, MD   We recommend signing up for the patient portal called MyChart.  Sign up information is provided on this After Visit Summary.  MyChart is used to connect with patients for Virtual Visits (Telemedicine).  Patients are able to view lab/test results, encounter notes, upcoming appointments, etc.  Non-urgent messages can be sent to your provider as well.   To learn more about what you can do with MyChart, go to [email or web address].   Other Instructions Your physician has recommended that you have an Itamar sleep study. This test records several body functions during sleep, including: brain activity, eye movement, oxygen and carbon dioxide blood levels, heart rate and rhythm, breathing rate and rhythm, the flow of air through your mouth and nose, snoring, body muscle movements, and chest and belly movement. Someone from our sleep studies team will call you with the pin information for the sleep study. Once you have the pin, you will be able to begin your at-home sleep study.

## 2024-09-11 NOTE — Progress Notes (Unsigned)
 Enrolled patient for a 7 day Zio XT monitor to be mailed to patients home  Ganji to read

## 2024-09-29 ENCOUNTER — Ambulatory Visit: Admitting: Neurology

## 2024-09-29 ENCOUNTER — Encounter: Payer: Self-pay | Admitting: Neurology

## 2024-09-29 VITALS — BP 146/86 | HR 98 | Ht 62.0 in | Wt 222.0 lb

## 2024-09-29 DIAGNOSIS — M545 Low back pain, unspecified: Secondary | ICD-10-CM

## 2024-09-29 DIAGNOSIS — G8929 Other chronic pain: Secondary | ICD-10-CM

## 2024-09-29 DIAGNOSIS — K3184 Gastroparesis: Secondary | ICD-10-CM | POA: Insufficient documentation

## 2024-09-29 DIAGNOSIS — R5383 Other fatigue: Secondary | ICD-10-CM | POA: Diagnosis not present

## 2024-09-29 DIAGNOSIS — R52 Pain, unspecified: Secondary | ICD-10-CM | POA: Insufficient documentation

## 2024-09-29 NOTE — Progress Notes (Signed)
 "   ASSESSMENT AND PLAN 58 y.o. year old female    Worsening low back pain Fatigue, body achy pain lack of stamina  Essentially normal neurological examinations  Referred to physical therapy  Laboratory evaluation to rule out inflammatory markers  She has 20 pound weight gain for past 1 year, with BMI of 40, would benefit diet control, moderate exercise, please refer to sleep study,   DIAGNOSTIC DATA (LABS, IMAGING, TESTING) - I reviewed patient records, labs, notes, testing and imaging myself where available.    HISTORY : TATTIANA FAKHOURI is a 58 year old female, seen in request by Dr. Milissa Clack, for evaluation of numbness ,worsening neck pain, her primary care physician is Dr. Clarice, Ryan, initial evaluation was on November 15, 2021   I reviewed and summarized the referring note. PMHX. 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  In 2023, she also noticed left toe numbness, she also complains of pain from her back radiating to her spine, muscle tightness,  She denies gait abnormality, denies incontinence  X-ray of cervical spine showed cervical degenerative changes,   She also presented to emergency room September 2022 for significant headache, dizziness, chest pain  Was found to have significantly elevated blood pressure 182/113, now on polypharmacy treatment for her blood pressure, also hyperlipidemia,  She was also seen by cardiologist Dr. Margaretann,  evidence of coronary artery disease on aspirin  81 mg daily, Crestor  20mg   Since then she was started on, she complains of diffuse body achiness, lack of stamina,   EMG nerve conduction study in March 2023 showed no significant abnormality.  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, hard time managing her practice, increased depression anxiety, relative stable bilateral essential tremor, taking primidone  50 mg daily,  UPDATE Dec 29th 2025: She sold her practice in August 2025,   Complains of extreme fatigue, underwent cardiac workup, moderate coronary artery disease by CT angiogram in September 2022, echocardiogram showed normal ejection fraction 50 to 55%, mild concentric left ventricular hypertrophy, cardiac catheterization November 2025 only mild proximal LAD lesion less than 5% stenosis with mild calcification otherwise normal coronary arteries  She complains of extreme daytime fatigue, sleepiness, 20 pound weight gain over past 1 year, planning on to have sleep study,  Reported normal fall during functional test  Today her main concern is low back pain, lack of stamina, have to lean forward to finish dishwashing due to her low back pain, denies radiating pain to the lower extremities, deny persistent sensorimotor deficit, no bowel and bladder incontinence   PHYSICAL EXAMNIATION:    09/29/2024   10:07 AM 09/11/2024   11:57 AM 09/11/2024   11:23 AM  Vitals with BMI  Height 5' 2  5' 2.5  Weight 222 lbs  220 lbs  BMI 40.59  39.57  Systolic 146  126 118  Diastolic 86 82 88  Pulse 98  91    Gen: NAD, conversant, well nourised, well groomed                     Cardiovascular: Regular rate rhythm, no peripheral edema, warm, nontender. Eyes: Conjunctivae clear without  exudates or hemorrhage Neck: Supple, no carotid bruits. Pulmonary: Clear to auscultation bilaterally   NEUROLOGICAL EXAM:  MENTAL STATUS: Speech/cognition: Awake, alert oriented to history taking and casual conversation  CRANIAL NERVES: CN II: Visual fields are full to confrontation.  Pupils are round equal and briskly reactive to light. CN III, IV, VI: extraocular movement are normal. No ptosis. CN V: Facial sensation is intact to pinprick in all 3 divisions bilaterally. Corneal responses are intact.  CN VII: Face is symmetric with normal eye closure and smile. CN VIII: Hearing is normal to casual conversation CN IX, X: Palate elevates symmetrically. Phonation is normal. CN XI: Head turning and shoulder shrug are intact CN XII: Tongue is midline with normal movements and no atrophy.  MOTOR: There is no pronator drift of out-stretched arms. Muscle bulk and tone are normal. Muscle strength is normal.  REFLEXES: Reflexes are 2+ and symmetric at the biceps, triceps, knees, and ankles. Plantar responses are flexor.  SENSORY: Intact to light touch, pinprick, positional and vibratory sensation are intact in fingers and toes.  COORDINATION: Rapid alternating movements and fine finger movements are intact. There is no dysmetria on finger-to-nose and heel-knee-shin.    GAIT/STANCE: Posture is normal. Gait is steady with normal steps, base, arm swing, and turning. Heel and toe walking are normal. Tandem gait is normal.  Romberg is absent.    REVIEW OF SYSTEMS: Out of a complete 14 system review of symptoms, the patient complains only of the following symptoms, and all other reviewed systems are negative.  See HPI  ALLERGIES: Allergies  Allergen Reactions   Atorvastatin  Calcium  Other (See Comments)    Myalgia   Penicillins    Penicillin G Rash    HOME MEDICATIONS: Outpatient Medications Prior to Visit  Medication Sig Dispense Refill   Calcium  Magnesium Zinc 333-133-5 MG TABS  Take 1 tablet by mouth daily.     clorazepate  (TRANXENE ) 7.5 MG tablet TAKE 1 CAPSULE  DAILY MONDAY THRU THURSDAY AND IF NEEDED ON OTHER DAYS. (Patient taking differently: Take 7.5 mg by mouth daily as needed.) 90 tablet 3   ezetimibe  (ZETIA ) 10 MG tablet Take 1 tablet (10 mg total) by mouth daily after supper. 15 tablet 0   gentamicin cream (GARAMYCIN) 0.1 % Apply 1 Application topically 3 (three) times daily as needed.     ibuprofen (ADVIL) 200 MG tablet Take 200 mg by mouth daily as needed (takes 1-3 x week. (600mg  tablet) pain). (Patient taking differently: Take 200 mg by mouth. 4x weekly)     MAGNESIUM GLYCINATE COMPLEX PO Take 250 mg by mouth daily.     metoprolol  tartrate (LOPRESSOR ) 50 MG tablet Take 1 tablet (50 mg total) by mouth 2 (two) times daily. 180 tablet 3   mometasone (ELOCON) 0.1 % cream Apply 1 Application topically 2 (two) times daily as needed (ear).     Multiple Vitamins-Minerals (MULTIVITAMIN PO) Take 1 tablet by mouth daily.     olmesartan  (BENICAR ) 20 MG tablet Take 1 tablet (20 mg total) by mouth daily. 90 tablet 3   Omega 3 1000 MG CAPS Take 1,000 mg by mouth daily.     pantoprazole  (PROTONIX ) 20 MG  tablet Take 1 tablet (20 mg total) by mouth daily. 90 tablet 2   REPATHA SURECLICK 140 MG/ML SOAJ 1 ML SUBCUTANEOUS EVERY 2 WEEKS 30 DAYS     saccharomyces boulardii (FLORASTOR) 250 MG capsule Take 250 mg by mouth 2 (two) times daily. (Patient taking differently: Take 250 mg by mouth daily.)     venlafaxine XR (EFFEXOR-XR) 75 MG 24 hr capsule TAKE 1 CAPSULE BY MOUTH EVERY DAY WITH FOOD; Duration: 90     Magnesium Oxide 250 MG TABS Take 250 mg by mouth daily. (Patient not taking: Reported on 09/29/2024)     nitroGLYCERIN  (NITROSTAT ) 0.4 MG SL tablet Place 1 tablet (0.4 mg total) under the tongue every 5 (five) minutes as needed for up to 25 days for chest pain (if no relief after 3rd dose proceed to ED or call 911). (Patient not taking: Reported on 09/29/2024) 25 tablet 3    primidone  (MYSOLINE ) 50 MG tablet TAKE 1 TABLET BY MOUTH  DAILY AND AN ADDITIONAL 1/2 TABLET DAILY AS NEEDED (Patient not taking: Reported on 09/29/2024) 135 tablet 4   No facility-administered medications prior to visit.    PAST MEDICAL HISTORY: Past Medical History:  Diagnosis Date   Anxiety    Depression    Essential tremor    Heart murmur 10/03/2007   Hyperlipidemia    Hypertension     PAST SURGICAL HISTORY: Past Surgical History:  Procedure Laterality Date   FINGER SURGERY  1990   GALLBLADDER SURGERY  10/02/2001   Removed    LEFT HEART CATH AND CORONARY ANGIOGRAPHY N/A 08/26/2024   Procedure: LEFT HEART CATH AND CORONARY ANGIOGRAPHY;  Surgeon: Ladona Heinz, MD;  Location: [facility];  Service: Cardiovascular;  Laterality: N/A;    FAMILY HISTORY: Family History  Problem Relation Age of Onset   Breast cancer Mother        over 64   Heart disease Mother    Heart failure Mother    Atrial fibrillation Mother    Heart disease Father    Heart attack Brother    Heart disease Brother     SOCIAL HISTORY: Social History   Socioeconomic History   Marital status: Married    Spouse name: Nancyann   Number of children: 0   Years of education: 12+   Highest education level: Doctorate  Occupational History   Occupation: Oral Surgon  Tobacco Use   Smoking status: Never   Smokeless tobacco: Never  Vaping Use   Vaping status: Never Used  Substance and Sexual Activity   Alcohol use: Yes    Alcohol/week: 2.0 standard drinks of alcohol    Types: 2 Standard drinks or equivalent per week    Comment: Occ on weekends    Drug use: No   Sexual activity: Yes    Birth control/protection: None  Other Topics Concern   Not on file  Social History Narrative   Patient is an transport planner.    Patient lives at home with husband [name].    Patient has no children.    Patient is working at a [employer].    Patient is right handed.    Social Drivers of Health    Tobacco Use: Low Risk (09/29/2024)   Patient History    Smoking Tobacco Use: Never    Smokeless Tobacco Use: Never    Passive Exposure: Not on file  Financial Resource Strain: Not on file  Food Insecurity: Not on file  Transportation Needs: Not on file  Physical Activity: Not on file  Stress: Not on file  Social Connections: Not on file  Intimate Partner Violence: Not on file  Depression (EYV7-0): Not on file  Alcohol Screen: Not on file  Housing: Not on file  Utilities: Not on file  Health Literacy: Not on file     Modena Callander. M.D. Ph.D.   VER[facility]

## 2024-09-30 ENCOUNTER — Ambulatory Visit: Payer: Self-pay | Admitting: Neurology

## 2024-09-30 LAB — SEDIMENTATION RATE: Sed Rate: 9 mm/h (ref 0–40)

## 2024-09-30 LAB — COMPREHENSIVE METABOLIC PANEL WITH GFR
ALT: 44 IU/L — ABNORMAL HIGH (ref 0–32)
AST: 31 IU/L (ref 0–40)
Albumin: 4.3 g/dL (ref 3.8–4.9)
Alkaline Phosphatase: 112 IU/L (ref 49–135)
BUN/Creatinine Ratio: 18 (ref 9–23)
BUN: 14 mg/dL (ref 6–24)
Bilirubin Total: 0.2 mg/dL (ref 0.0–1.2)
CO2: 21 mmol/L (ref 20–29)
Calcium: 10.2 mg/dL (ref 8.7–10.2)
Chloride: 106 mmol/L (ref 96–106)
Creatinine, Ser: 0.8 mg/dL (ref 0.57–1.00)
Globulin, Total: 2.3 g/dL (ref 1.5–4.5)
Glucose: 139 mg/dL — ABNORMAL HIGH (ref 70–99)
Potassium: 4.6 mmol/L (ref 3.5–5.2)
Sodium: 143 mmol/L (ref 134–144)
Total Protein: 6.6 g/dL (ref 6.0–8.5)
eGFR: 85 mL/min/1.73

## 2024-09-30 LAB — TSH: TSH: 1.72 u[IU]/mL (ref 0.450–4.500)

## 2024-09-30 LAB — VITAMIN D 25 HYDROXY (VIT D DEFICIENCY, FRACTURES): Vit D, 25-Hydroxy: 54.9 ng/mL (ref 30.0–100.0)

## 2024-09-30 LAB — C-REACTIVE PROTEIN: CRP: 2 mg/L (ref 0–10)

## 2024-09-30 LAB — CK: Total CK: 67 U/L (ref 32–182)

## 2024-12-11 ENCOUNTER — Ambulatory Visit: Admitting: Cardiology
